# Patient Record
Sex: Female | Born: 1965 | Race: Black or African American | Hispanic: No | Smoking: Never smoker
Health system: Southern US, Community
[De-identification: ages and names within clinical notes are randomized; demographics above are authoritative.]

## PROBLEM LIST (undated history)

## (undated) DIAGNOSIS — K219 Gastro-esophageal reflux disease without esophagitis: Secondary | ICD-10-CM

## (undated) DIAGNOSIS — I10 Essential (primary) hypertension: Secondary | ICD-10-CM

## (undated) DIAGNOSIS — M797 Fibromyalgia: Secondary | ICD-10-CM

## (undated) DIAGNOSIS — M1990 Unspecified osteoarthritis, unspecified site: Secondary | ICD-10-CM

## (undated) HISTORY — PX: OTHER SURGICAL HISTORY: SHX169

## (undated) HISTORY — DX: Fibromyalgia: M79.7

## (undated) HISTORY — PX: HEMORRHOID SURGERY: SHX153

## (undated) HISTORY — PX: EXPLORATORY LAPAROTOMY: SUR591

## (undated) HISTORY — PX: CHOLECYSTECTOMY: SHX55

---

## 2002-09-14 ENCOUNTER — Emergency Department (HOSPITAL_COMMUNITY): Admission: EM | Admit: 2002-09-14 | Discharge: 2002-09-15 | Payer: Self-pay | Admitting: Emergency Medicine

## 2003-03-29 ENCOUNTER — Other Ambulatory Visit: Admission: RE | Admit: 2003-03-29 | Discharge: 2003-03-29 | Payer: Self-pay | Admitting: Family Medicine

## 2004-07-08 ENCOUNTER — Encounter: Admission: RE | Admit: 2004-07-08 | Discharge: 2004-07-08 | Payer: Self-pay | Admitting: Neurology

## 2004-08-02 ENCOUNTER — Emergency Department (HOSPITAL_COMMUNITY): Admission: EM | Admit: 2004-08-02 | Discharge: 2004-08-03 | Payer: Self-pay | Admitting: Emergency Medicine

## 2005-05-04 ENCOUNTER — Emergency Department (HOSPITAL_COMMUNITY): Admission: EM | Admit: 2005-05-04 | Discharge: 2005-05-05 | Payer: Self-pay | Admitting: Emergency Medicine

## 2005-11-05 ENCOUNTER — Other Ambulatory Visit: Admission: RE | Admit: 2005-11-05 | Discharge: 2005-11-05 | Payer: Self-pay | Admitting: Obstetrics and Gynecology

## 2006-04-22 ENCOUNTER — Inpatient Hospital Stay (HOSPITAL_COMMUNITY): Admission: AD | Admit: 2006-04-22 | Discharge: 2006-04-23 | Payer: Self-pay | Admitting: Obstetrics & Gynecology

## 2006-04-23 ENCOUNTER — Inpatient Hospital Stay (HOSPITAL_COMMUNITY): Admission: AD | Admit: 2006-04-23 | Discharge: 2006-04-26 | Payer: Self-pay | Admitting: Obstetrics & Gynecology

## 2006-06-19 ENCOUNTER — Inpatient Hospital Stay (HOSPITAL_COMMUNITY): Admission: AD | Admit: 2006-06-19 | Discharge: 2006-06-19 | Payer: Self-pay | Admitting: Obstetrics & Gynecology

## 2006-06-19 ENCOUNTER — Inpatient Hospital Stay (HOSPITAL_COMMUNITY): Admission: AD | Admit: 2006-06-19 | Discharge: 2006-06-20 | Payer: Self-pay | Admitting: Obstetrics and Gynecology

## 2006-06-19 ENCOUNTER — Ambulatory Visit: Payer: Self-pay | Admitting: Obstetrics and Gynecology

## 2006-06-23 ENCOUNTER — Inpatient Hospital Stay (HOSPITAL_COMMUNITY): Admission: AD | Admit: 2006-06-23 | Discharge: 2006-06-26 | Payer: Self-pay | Admitting: Obstetrics & Gynecology

## 2006-07-30 ENCOUNTER — Emergency Department (HOSPITAL_COMMUNITY): Admission: EM | Admit: 2006-07-30 | Discharge: 2006-07-30 | Payer: Self-pay | Admitting: Emergency Medicine

## 2006-09-25 ENCOUNTER — Encounter (INDEPENDENT_AMBULATORY_CARE_PROVIDER_SITE_OTHER): Payer: Self-pay | Admitting: Specialist

## 2006-09-25 ENCOUNTER — Ambulatory Visit (HOSPITAL_BASED_OUTPATIENT_CLINIC_OR_DEPARTMENT_OTHER): Admission: RE | Admit: 2006-09-25 | Discharge: 2006-09-25 | Payer: Self-pay | Admitting: General Surgery

## 2006-09-26 ENCOUNTER — Emergency Department (HOSPITAL_COMMUNITY): Admission: EM | Admit: 2006-09-26 | Discharge: 2006-09-27 | Payer: Self-pay | Admitting: Emergency Medicine

## 2007-08-15 ENCOUNTER — Encounter: Admission: RE | Admit: 2007-08-15 | Discharge: 2007-08-15 | Payer: Self-pay | Admitting: Orthopedic Surgery

## 2008-06-21 ENCOUNTER — Emergency Department (HOSPITAL_COMMUNITY): Admission: EM | Admit: 2008-06-21 | Discharge: 2008-06-22 | Payer: Self-pay | Admitting: Emergency Medicine

## 2009-05-31 ENCOUNTER — Emergency Department (HOSPITAL_COMMUNITY): Admission: EM | Admit: 2009-05-31 | Discharge: 2009-05-31 | Payer: Self-pay | Admitting: Emergency Medicine

## 2009-09-10 ENCOUNTER — Emergency Department (HOSPITAL_COMMUNITY): Admission: EM | Admit: 2009-09-10 | Discharge: 2009-09-10 | Payer: Self-pay | Admitting: Emergency Medicine

## 2009-09-26 ENCOUNTER — Encounter (INDEPENDENT_AMBULATORY_CARE_PROVIDER_SITE_OTHER): Payer: Self-pay | Admitting: General Surgery

## 2009-09-26 ENCOUNTER — Ambulatory Visit (HOSPITAL_COMMUNITY): Admission: RE | Admit: 2009-09-26 | Discharge: 2009-09-27 | Payer: Self-pay | Admitting: General Surgery

## 2010-08-18 ENCOUNTER — Encounter: Payer: Self-pay | Admitting: Obstetrics and Gynecology

## 2010-10-16 LAB — DIFFERENTIAL
Basophils Absolute: 0 10*3/uL (ref 0.0–0.1)
Basophils Relative: 1 % (ref 0–1)
Eosinophils Absolute: 0.2 10*3/uL (ref 0.0–0.7)
Eosinophils Relative: 5 % (ref 0–5)
Lymphocytes Relative: 44 % (ref 12–46)
Lymphs Abs: 1.8 10*3/uL (ref 0.7–4.0)
Monocytes Absolute: 0.3 10*3/uL (ref 0.1–1.0)
Monocytes Relative: 8 % (ref 3–12)
Neutro Abs: 1.7 10*3/uL (ref 1.7–7.7)
Neutrophils Relative %: 42 % — ABNORMAL LOW (ref 43–77)

## 2010-10-16 LAB — LIPASE, BLOOD: Lipase: 19 U/L (ref 11–59)

## 2010-10-16 LAB — URINALYSIS, ROUTINE W REFLEX MICROSCOPIC
Bilirubin Urine: NEGATIVE
Glucose, UA: NEGATIVE mg/dL
Ketones, ur: NEGATIVE mg/dL
Leukocytes, UA: NEGATIVE
Nitrite: NEGATIVE
Protein, ur: NEGATIVE mg/dL
Specific Gravity, Urine: 1.015 (ref 1.005–1.030)
Urobilinogen, UA: 1 mg/dL (ref 0.0–1.0)
pH: 6 (ref 5.0–8.0)

## 2010-10-16 LAB — COMPREHENSIVE METABOLIC PANEL
ALT: 19 U/L (ref 0–35)
AST: 18 U/L (ref 0–37)
Albumin: 3.8 g/dL (ref 3.5–5.2)
Alkaline Phosphatase: 45 U/L (ref 39–117)
BUN: 9 mg/dL (ref 6–23)
CO2: 26 mEq/L (ref 19–32)
Calcium: 8.9 mg/dL (ref 8.4–10.5)
Chloride: 106 mEq/L (ref 96–112)
Creatinine, Ser: 0.84 mg/dL (ref 0.4–1.2)
GFR calc Af Amer: >60 mL/min (ref >60)
GFR calc non Af Amer: >60 mL/min (ref >60)
Glucose, Bld: 87 mg/dL (ref 70–99)
Potassium: 3.4 mEq/L — ABNORMAL LOW (ref 3.5–5.1)
Sodium: 137 mEq/L (ref 135–145)
Total Bilirubin: 0.5 mg/dL (ref 0.3–1.2)
Total Protein: 7.9 g/dL (ref 6.0–8.3)

## 2010-10-16 LAB — CBC
HCT: 39.3 % (ref 36.0–46.0)
Hemoglobin: 13.8 g/dL (ref 12.0–15.0)
MCHC: 35.2 g/dL (ref 30.0–36.0)
MCV: 95.3 fL (ref 78.0–100.0)
Platelets: 225 10*3/uL (ref 150–400)
RBC: 4.13 MIL/uL (ref 3.87–5.11)
RDW: 13.2 % (ref 11.5–15.5)
WBC: 4.1 10*3/uL (ref 4.0–10.5)

## 2010-10-16 LAB — URINE MICROSCOPIC-ADD ON

## 2010-10-16 LAB — POCT PREGNANCY, URINE: Preg Test, Ur: NEGATIVE

## 2010-10-20 LAB — COMPREHENSIVE METABOLIC PANEL
ALT: 17 U/L (ref 0–35)
AST: 17 U/L (ref 0–37)
Albumin: 4 g/dL (ref 3.5–5.2)
Alkaline Phosphatase: 50 U/L (ref 39–117)
BUN: 7 mg/dL (ref 6–23)
CO2: 25 mEq/L (ref 19–32)
Calcium: 9.2 mg/dL (ref 8.4–10.5)
Chloride: 103 mEq/L (ref 96–112)
Creatinine, Ser: 0.85 mg/dL (ref 0.4–1.2)
GFR calc Af Amer: >60 mL/min (ref >60)
GFR calc non Af Amer: >60 mL/min (ref >60)
Glucose, Bld: 91 mg/dL (ref 70–99)
Potassium: 3.5 mEq/L (ref 3.5–5.1)
Sodium: 136 mEq/L (ref 135–145)
Total Bilirubin: 0.8 mg/dL (ref 0.3–1.2)
Total Protein: 7.8 g/dL (ref 6.0–8.3)

## 2010-10-20 LAB — CBC
HCT: 40.4 % (ref 36.0–46.0)
Hemoglobin: 14.1 g/dL (ref 12.0–15.0)
MCHC: 34.9 g/dL (ref 30.0–36.0)
MCV: 94.9 fL (ref 78.0–100.0)
Platelets: 210 10*3/uL (ref 150–400)
Platelets: 225 10*3/uL (ref 150–400)
RBC: 4.26 MIL/uL (ref 3.87–5.11)
RDW: 13 % (ref 11.5–15.5)
WBC: 10.3 10*3/uL (ref 4.0–10.5)
WBC: 4.4 10*3/uL (ref 4.0–10.5)

## 2010-10-20 LAB — ABO/RH: ABO/RH(D): A POS

## 2010-10-20 LAB — TYPE AND SCREEN
ABO/RH(D): A POS
Antibody Screen: NEGATIVE

## 2010-10-20 LAB — DIFFERENTIAL
Basophils Absolute: 0 10*3/uL (ref 0.0–0.1)
Basophils Relative: 1 % (ref 0–1)
Eosinophils Absolute: 0.2 10*3/uL (ref 0.0–0.7)
Eosinophils Relative: 5 % (ref 0–5)
Lymphocytes Relative: 36 % (ref 12–46)
Lymphs Abs: 1.6 10*3/uL (ref 0.7–4.0)
Monocytes Absolute: 0.4 10*3/uL (ref 0.1–1.0)
Monocytes Relative: 9 % (ref 3–12)
Neutro Abs: 2.2 10*3/uL (ref 1.7–7.7)
Neutrophils Relative %: 49 % (ref 43–77)

## 2010-10-20 LAB — PREGNANCY, URINE: Preg Test, Ur: NEGATIVE

## 2010-12-13 NOTE — Discharge Summary (Signed)
NAMEKRYSTL, WICKWARE NO.:  [id number]   MEDICAL RECORD NO.:  [id number]          PATIENT TYPE:  INP   LOCATION:  9134                          [facility]ACILITY:  [facility]   PHYSICIAN:  Gerrit Friends. Aldona Bar, M.D.   DATE OF BIRTH:  [birth year]   DATE OF ADMISSION:  06/23/2006  DATE OF DISCHARGE:  06/26/2006                               DISCHARGE SUMMARY   DISCHARGE DIAGNOSES:  1. Term pregnancy, delivered 7 pounds 4 ounces female infant, Apgar's 8      and 9.  2. Blood type A positive.   PROCEDURES:  1. Low forceps delivery.  2. Second-degree tear repair.  3. Right vaginal wall tear and repair.  4. Clitoral tear and repair.   SUMMARY:  This 45 year old primigravida with a due date of June 25, 2006, was admitted on June 23, 2006, in labor.  She progressed  through the first stage without difficulty and during the first stage  underwent amnioinfusion secondary to meconium-stained fluid.  She also  had Pitocin augmentation and requested and received an epidural.  There  was a fairly poor pushing effort during the second stage of labor which  ultimately required a low forceps delivery by Dr. Dareen Piano with delivery  of a 7 pounds and 4 ounces female infant with Apgar's of  8 and 9, over  second-degree tear with other tears which were repaired as well.  Her postpartum course was fairly benign.  On the morning of June 26, 2006, she was ambulating well, tolerating a regular diet well, having  normal bowel and bladder function, was afebrile.  Her breast-feeding was  going well.  And, she was desirous of discharge.   Her discharge hemoglobin was 9.9 with a white count of 13,500 and a  platelet count 213,000.  Accordingly, on the morning of June 26, 2006, she was given all appropriate instructions and understood all  instructions well.   DISCHARGE MEDICATIONS:  1. Vitamins, one a day.  2. Feosol capsules, one a day.  3. Tylox 1-2 every four to six hours as needed for  severe pain.  4. Motrin 600 mg every 6 hours as needed for pain or cramps.   She will return to the office for followup in approximately four weeks'  time or as needed.   CONDITION ON DISCHARGE:  Improved.     Gerrit Friends. Aldona Bar, M.D.  Electronically Signed    RMW/MEDQ  D:  06/26/2006  T:  06/26/2006  Job:  [id number]

## 2010-12-13 NOTE — Op Note (Signed)
NAMERUHAMA, LEHEW NO.:  [id number]   MEDICAL RECORD NO.:  [id number]          PATIENT TYPE:  AMB   LOCATION:  NESC                         [facility]AC[facility]  [facility]   PHYSICIAN:  Laura Medina, M.D.DATE OF BIRTH:  1966-02-04   DATE OF PROCEDURE:  09/25/2006  DATE OF DISCHARGE:                               OPERATIVE REPORT   PREOPERATIVE DIAGNOSIS:  Internal and external hemorrhoids.   OPERATION:  Excision of two internal and external hemorrhoids, the  largest was anterior and a medium-sized right lateral posterior.   ANESTHESIA:  General anesthesia in the lithotomy position.   SURGEON:  Laura Pancoast. Laura Medina, M.D.   INDICATIONS FOR PROCEDURE:  [name] is a 45 year old  female, whom I first saw in December when she was a few weeks  postpartum.  She had significant problems with hemorrhoids.  I suggested  that we wait and let things improve from just getting over her delivery.  She returned recently.  Dr. Caryn Bee L. Medina is her regular medical  physician.  The large hemorrhoid anterior was still bothersome.  The two  on the other quadrants had resolved, so that they were essentially what  I considered normal.  She is a heavy individual and wanted to proceed on  with the removal of the large hemorrhoid.  I discussed with her that if  we saw anything that was actually significantly abnormal in the other  quadrants, that we would remove them, but it might be just a simple  internal and external hemorrhoid anteriorly.   DESCRIPTION OF PROCEDURE:  The patient was taken to the operative suite  and positioned on the operating room table.  She had received 1 gram of  Ancef, and the patient was placed up in the __________ stirrups, after  induction of anesthesia.  In this position, the hemorrhoid anteriorly  certainly was the largest.  It looked like it possibly was a Medina bit  more prominent on the right lateral than when I had seen her in the  office.  I anesthetized the area with Marcaine, about 20 mL, and  Sensorcaine with adrenalin.  Next, on an anoscopic exam the largest of  course is the anterior hemorrhoid, and I elevated it from the sphincter  under direct vision and then used a Buie clamp down to the internal  portion of the hemorrhoidectomy, clamped it and removed the hemorrhoid.  I did a couple of U-stitches of #2-0 chromic for hemostasis and then  sutured over the clamp and removed the clamp and enclosed the external  portion of the hemorrhoidectomy incision.  A couple of #3-0 chromic  sutures were removed for a Medina bleeding right on the skin edges.  After I completed this, the area on the right lateral posterior was  certainly more prominent and I thought it would best to go ahead and  excise this internal and external hemorrhoid as a much smaller  hemorrhoid in the same technique.  I elevated it off the sphincter with  sharp dissection, and then under-clamping the base of the internal  hemorrhoid, removing the hemorrhoid.  A couple of #2-0  chromic sutures  for hemostasis under the clamp, and then suturing over the clamp.  I  removed the clamp and then closed the external portion of the  hemorrhoidectomy.  A single figure-of-eight of #3-0 chromic was used on  one Medina skin edge in the anal canal.  The patient tolerated the  procedure nicely.  I used the anesthetic solution ointment within the  anal canal, having placed a gauze 4 x 4 and then compressive 4 x 4's and  held in place with Medina stretch panties.   The patient tolerated the procedure and will be released after a short  stay.  She is nursing, and I recommended that she not nurse for  approximately two days.  If she is not requiring much pain medication,  she can use the 5% Xylocaine ointment and resume nursing probably next  Tuesday.  The patient will be seen back in the office in about 10 days.  Hopefully she will not have too much significant  discomfort  postoperatively.           ______________________________  Laura Pancoast. Laura Medina, M.D.     WJW/MEDQ  D:  09/25/2006  T:  09/25/2006  Job:  [id number]   cc:   Laura Medina, M.D.  Fax: [phone]

## 2010-12-13 NOTE — Discharge Summary (Signed)
Laura Medina, Laura Medina            ACCOUNT NO.:  [id number]   MEDICAL RECORD NO.:  [id number]          PATIENT TYPE:  INP   LOCATION:  9159                          [facility]ILITY:  [facility]   PHYSICIAN:  Kendra H. Tenny Craw, MD     DATE OF BIRTH:  [birth year]   DATE OF ADMISSION:  04/23/2006  DATE OF DISCHARGE:  04/26/2006                                 DISCHARGE SUMMARY   FINAL DIAGNOSES:  1. Intrauterine pregnancy at 31-3/7th weeks gestation.  2. Preterm labor with shortened cervical length.  3. Right lower abdominal pain.   COMPLICATIONS:  None.   This 45 year old G1, P0 presents at 31+ weeks' gestation complaining of some  right lower quadrant pain.  The patient had an ultrasound performed which  showed a cervix that was shortened to 1.70 cm.  The patient was admitted at  this time to start a betamethasone protocol and to watch the patient  carefully.  The patient upon admission was started on betamethasone.  The  patient had already had a group B strep culture obtained in the office.  Lab  work was obtained and the patient was started on Procardia orally 10 mg  every 6 hours.  A renal ultrasound was performed which showed no evidence of  hydronephrosis.  Her strips were nice and reactive and she had normal OB  ultrasound except for a shortened cervix.  A fetal fibronectin was performed  and this came back negative.   By hospital day #4, the patient was doing well.  Her right lower quadrant  pain had significantly decreased and she was felt ready for discharge on her  Procardia.  She was sent home on a regular diet, told to decrease her  activities, was given Procardia 10 mg one every 6 hours to take and of  course to call with any increased pain, bleeding or problems.   LABS ON DISCHARGE:  The patient had a hemoglobin of 13.1, white blood cell  count of 9.4 and negative fetal fibronectin.      Leilani Able, P.A.-C.    ______________________________  Freddrick March. Tenny Craw, MD    MB/MEDQ  D:  05/26/2006  T:  05/26/2006  Job:  [id number]

## 2011-04-29 LAB — URINALYSIS, ROUTINE W REFLEX MICROSCOPIC
Nitrite: NEGATIVE
Specific Gravity, Urine: 1.038 — ABNORMAL HIGH
pH: 5.5

## 2011-04-29 LAB — COMPREHENSIVE METABOLIC PANEL
ALT: 17
AST: 31
Albumin: 3.6
CO2: 22
Calcium: 8.3 — ABNORMAL LOW
Chloride: 104
GFR calc Af Amer: >60
GFR calc non Af Amer: >60
Sodium: 134 — ABNORMAL LOW

## 2011-04-29 LAB — DIFFERENTIAL
Eosinophils Absolute: 0.3
Eosinophils Relative: 5
Lymphs Abs: 1.4
Monocytes Absolute: 0.5

## 2011-04-29 LAB — CBC
MCHC: 34.5
Platelets: 228
RBC: 4.32
WBC: 5.5

## 2011-04-29 LAB — LIPASE, BLOOD: Lipase: 12

## 2011-09-25 ENCOUNTER — Emergency Department (HOSPITAL_COMMUNITY): Payer: BC Managed Care – PPO

## 2011-09-25 ENCOUNTER — Emergency Department (HOSPITAL_COMMUNITY)
Admission: EM | Admit: 2011-09-25 | Discharge: 2011-09-25 | Disposition: A | Discharge status: Home / Self Care | Payer: BC Managed Care – PPO | Attending: Emergency Medicine | Admitting: Emergency Medicine

## 2011-09-25 ENCOUNTER — Encounter (HOSPITAL_COMMUNITY): Payer: Self-pay | Admitting: *Deleted

## 2011-09-25 DIAGNOSIS — S0990XA Unspecified injury of head, initial encounter: Secondary | ICD-10-CM

## 2011-09-25 DIAGNOSIS — M546 Pain in thoracic spine: Secondary | ICD-10-CM | POA: Insufficient documentation

## 2011-09-25 DIAGNOSIS — R110 Nausea: Secondary | ICD-10-CM | POA: Insufficient documentation

## 2011-09-25 DIAGNOSIS — R51 Headache: Secondary | ICD-10-CM | POA: Insufficient documentation

## 2011-09-25 DIAGNOSIS — S060X0A Concussion without loss of consciousness, initial encounter: Secondary | ICD-10-CM | POA: Insufficient documentation

## 2011-09-25 DIAGNOSIS — M549 Dorsalgia, unspecified: Secondary | ICD-10-CM

## 2011-09-25 DIAGNOSIS — R296 Repeated falls: Secondary | ICD-10-CM | POA: Insufficient documentation

## 2011-09-25 HISTORY — DX: Unspecified osteoarthritis, unspecified site: M19.90

## 2011-09-25 MED ORDER — IBUPROFEN 800 MG PO TABS
800.0000 mg | ORAL_TABLET | Freq: Once | ORAL | Status: AC
  Administered 2011-09-25: 800 mg via ORAL
  Filled 2011-09-25: qty 1

## 2011-09-25 MED ORDER — ONDANSETRON 8 MG PO TBDP: 8.0000 mg | ORAL_TABLET | Freq: Three times a day (TID) | ORAL | Status: AC | PRN

## 2011-09-25 MED ORDER — ONDANSETRON 8 MG PO TBDP
8.0000 mg | ORAL_TABLET | Freq: Once | ORAL | Status: AC
  Administered 2011-09-25: 8 mg via ORAL
  Filled 2011-09-25: qty 1

## 2011-09-25 MED ORDER — IBUPROFEN 800 MG PO TABS: 800.0000 mg | ORAL_TABLET | Freq: Three times a day (TID) | ORAL | Status: AC | PRN

## 2011-09-25 NOTE — Discharge Instructions (Signed)
Back Pain, Adult Low back pain is very common. About 1 in 5 people have back pain.The cause of low back pain is rarely dangerous. The pain often gets better over time.About half of people with a sudden onset of back pain feel better in just 2 weeks. About 8 in 10 people feel better by 6 weeks.  CAUSES Some common causes of back pain include:  Strain of the muscles or ligaments supporting the spine.   Wear and tear (degeneration) of the spinal discs.   Arthritis.   Direct injury to the back.  DIAGNOSIS Most of the time, the direct cause of low back pain is not known.However, back pain can be treated effectively even when the exact cause of the pain is unknown.Answering your caregiver's questions about your overall health and symptoms is one of the most accurate ways to make sure the cause of your pain is not dangerous. If your caregiver needs more information, he or she may order lab work or imaging tests (X-rays or MRIs).However, even if imaging tests show changes in your back, this usually does not require surgery. HOME CARE INSTRUCTIONS For many people, back pain returns.Since low back pain is rarely dangerous, it is often a condition that people can learn to manageon their own.   Remain active. It is stressful on the back to sit or stand in one place. Do not sit, drive, or stand in one place for more than 30 minutes at a time. Take short walks on level surfaces as soon as pain allows.Try to increase the length of time you walk each day.   Do not stay in bed.Resting more than 1 or 2 days can delay your recovery.   Do not avoid exercise or work.Your body is made to move.It is not dangerous to be active, even though your back may hurt.Your back will likely heal faster if you return to being active before your pain is gone.   Pay attention to your body when you bend and lift. Many people have less discomfortwhen lifting if they bend their knees, keep the load close to their  bodies,and avoid twisting. Often, the most comfortable positions are those that put less stress on your recovering back.   Find a comfortable position to sleep. Use a firm mattress and lie on your side with your knees slightly bent. If you lie on your back, put a pillow under your knees.   Only take over-the-counter or prescription medicines as directed by your caregiver. Over-the-counter medicines to reduce pain and inflammation are often the most helpful.Your caregiver may prescribe muscle relaxant drugs.These medicines help dull your pain so you can more quickly return to your normal activities and healthy exercise.   Put ice on the injured area.   Put ice in a plastic bag.   Place a towel between your skin and the bag.   Leave the ice on for 15 to 20 minutes, 3 to 4 times a day for the first 2 to 3 days. After that, ice and heat may be alternated to reduce pain and spasms.   Ask your caregiver about trying back exercises and gentle massage. This may be of some benefit.   Avoid feeling anxious or stressed.Stress increases muscle tension and can worsen back pain.It is important to recognize when you are anxious or stressed and learn ways to manage it.Exercise is a great option.  SEEK MEDICAL CARE IF:  You have pain that is not relieved with rest or medicine.   You have   pain that does not improve in 1 week.   You have new symptoms.   You are generally not feeling well.  SEEK IMMEDIATE MEDICAL CARE IF:   You have pain that radiates from your back into your legs.   You develop new bowel or bladder control problems.   You have unusual weakness or numbness in your arms or legs.   You develop nausea or vomiting.   You develop abdominal pain.   You feel faint.  Document Released: 07/14/2005 Document Revised: 03/26/2011 Document Reviewed: 12/02/2010 [facility] Patient Information 2012 [place], [place].Concussion Direct trauma to the head often causes a condition known as a  concussion. This injury will interfere with brain function and may cause you to lose consciousness. The consequences of a concussion are usually temporary, but repetitive concussions can be very dangerous. If you have multiple concussions, you will have a greater risk of long-term effects, such as slurred speech, slow movements, impaired thinking, or tremors. The severity of a concussion is based on the length and severity of the interference with brain activity. SYMPTOMS  Symptoms of a concussion vary depending on the severity of the injury. Very mild concussions may even occur without any noticeable symptoms. Swelling in the area of the injury is not related to the seriousness of the injury.   Mild concussion:   Temporary loss of consciousness.   Memory loss (amnesia) for a short time.   Emotional instability.   Confusion.   Severe concussion:   Usually prolonged loss of consciousness.   One pupil (the black part in the middle of the eye) is larger than the other.   Changes in vision (including blurring).   Changes in breathing.   Disturbed balance (equilibrium).   Headaches.   Confusion.   Nausea or vomiting.  CAUSES  A concussion is the result of trauma to the head. When the head is subjected to such an injury, the brain strikes against the inner wall of the skull. This impact is what causes the damage to the brain. The force of injury is related to severity of injury. The most severe concussions are associated with incidents that involve large impact forces such as motor vehicle accidents. Wearing a helmet will reduce the severity of trauma to the head, but concussions may still occur if you are wearing a helmet. RISK INCREASES WITH:  Contact sports (football, hockey, rugby, or lacrosse).   Fighting sports (martial arts or boxing).   Riding bicycles, motorcycles, or horses (when you ride without a helmet).  PREVENTION  Wear proper protective headgear and ensure correct  fit.   Wear seat belts when driving and riding in a car.   Do not drink or use mind-altering drugs and drive.  PROGNOSIS  Concussions are typically curable if they are recognized and treated early. If a severe concussion or multiple concussions go untreated, then the complications may be life-threatening or cause permanent disability and brain damage. RELATED COMPLICATIONS   Permanent brain damage (slurred speech, slow movement, impaired thinking, or tremors).   Bleeding under the skull (subdural hemorrhage or hematoma, epidural hematoma).   Bleeding into the brain.   Prolonged healing time if usual activities are resumed too soon.   Infection if skin over the concussion site is broken.   Increased risk of future concussions (less trauma is required for a second concussion than the first).  TREATMENT  Treatment initially requires immediate evaluation to determine the severity of the concussion. Occasionally, a hospital stay may be required for observation  and treatment.  Avoid exertion. Bed rest for the first 24 to 48 hours is recommended.  Return to play is a controversial subject due to the increased risk for future injury as well as permanent disability and should be discussed at length with your treating caregiver. Many factors such as the severity of the concussion and whether this is the first, second, or third concussion play a role in timing a patient's return to sports.  MEDICATION  Do not give any medicine, including non-prescription acetaminophen or aspirin, until the diagnosis is certain. These medicines may mask developing symptoms.  SEEK IMMEDIATE MEDICAL CARE IF:   Symptoms get worse or do not improve in 24 hours.   Any of the following symptoms occur:   Vomiting.   The inability to move arms and legs equally well on both sides.   Fever.   Neck stiffness.   Pupils of unequal size, shape, or reactivity.   Convulsions.   Noticeable restlessness.   Severe  headache that persists for longer than 4 hours after injury.   Confusion, disorientation, or mental status changes.  Document Released: 07/14/2005 Document Revised: 03/26/2011 Document Reviewed: 10/26/2008 [facility] Patient Information 2012 [place], [place]. RESOURCE GUIDE  Dental Problems  Patients with Medicaid: [facility] [phone] [address]                                           [phone] [address] Phone:  [phone]                                                  Phone:  [phone]  If unable to pay or uninsured, contact:  [facility] or [facility]. to become qualified for the adult dental clinic.  Chronic Pain Problems Contact [facility]  [phone] Patients need to be referred by their primary care doctor.  Insufficient Money for Medicine Contact [employer]:  call "211" or [facility] [phone].  No Primary Care Doctor Call [facility]  [phone] Other agencies that provide inexpensive medical care    [facility]  [phone]    [facility]  [phone]    [facility]  [phone]    [facility]  [phone]    [facility]  2183617687    [facility]  [phone]  Psychological Services [facility]  [phone] [facility]  2066947594 [facility]   [phone] (emergency services ([phone])  Substance Abuse Resources [facility]  661-408-7790 [facility] 843-731-8928 [facility] [phone] Floydene Flock 4326754871 Residential & Outpatient Substance Abuse Program  3104089317  Abuse/Neglect [facility] Child Abuse Hotline 260-035-4732 [facility] Child Abuse Hotline 757-331-9472 (After Hours)  Emergency Shelter [facility] 336 301 3720  Maternity Homes [facility] at [facility] [place] [facility] [place] 571-026-4111 [facility]  Services ([phone])058-1945  MRSA Hotline #:   (904)183-5589    [facility] [place]  [facility] Dept. [address]      [place] Phone:  [phone]                                   Phone:  [phone]                 Phone:  [place] Phone:  [place] [phone] (After Hours)

## 2011-09-25 NOTE — ED Provider Notes (Signed)
History     CSN: [id number]  Arrival date & time 09/25/11  1404   First MD Initiated Contact with Patient 09/25/11 1503      Chief Complaint  Patient presents with  . Head Injury    (Consider location/radiation/quality/duration/timing/severity/associated sxs/prior treatment) Patient is a 46 y.o. female presenting with fall. The history is provided by the patient.  Fall The accident occurred 3 to 5 hours ago. Incident: The patient went to sit down in a chair, missed the chair, and fell onto her buttocks, back, and hit the back of her head on the floor. She denies any loss of consciousness or neck pain, but reports headache, nausea, and mid back pain. She fell from a height of 1 to 2 ft. She landed on a hard floor. There was no blood loss. The point of impact was the head (Buttocks and mid back). The pain is present in the head (Mid back, thoracic spine). The pain is moderate. She was ambulatory at the scene. There was no entrapment after the fall. There was no drug use involved in the accident. There was no alcohol use involved in the accident. Associated symptoms include nausea and headaches. Pertinent negatives include no visual change, no fever, no numbness, no abdominal pain, no bowel incontinence, no vomiting, no hearing loss, no loss of consciousness and no tingling. Exacerbated by: Palpation. She has tried nothing for the symptoms.    Past Medical History  Diagnosis Date  . Arthritis     Past Surgical History  Procedure Date  . Hemorrhoid surgery   . Blood clot evacuation     left leg  . Exploratory laparotomy   . Cholecystectomy     History reviewed. No pertinent family history.  History  Substance Use Topics  . Smoking status: Never Smoker   . Smokeless tobacco: Not on file  . Alcohol Use: No    OB History    Grav Para Term Preterm Abortions TAB SAB Ect Mult Living                  Review of Systems  Constitutional: Negative for fever, chills, diaphoresis and  appetite change.  HENT: Negative for hearing loss, ear pain, congestion, sore throat, facial swelling, rhinorrhea, trouble swallowing, neck pain, neck stiffness, dental problem, sinus pressure and tinnitus.   Eyes: Negative for photophobia, pain, discharge, redness and visual disturbance.  Respiratory: Negative.   Cardiovascular: Negative.   Gastrointestinal: Positive for nausea. Negative for vomiting, abdominal pain and bowel incontinence.  Genitourinary: Negative.   Musculoskeletal: Positive for back pain. Negative for gait problem.  Skin: Negative for rash.  Neurological: Positive for headaches. Negative for dizziness, tingling, seizures, loss of consciousness, syncope, light-headedness and numbness.  Psychiatric/Behavioral: Negative for confusion.    Allergies  Hydrocodone; Oxycodone; and Penicillins  Home Medications   Current Outpatient Rx  Name Route Sig Dispense Refill  . TRAMADOL HCL 50 MG PO TABS Oral Take 50 mg by mouth every 6 (six) hours as needed. pain      BP 143/69  Pulse 74  Temp(Src) 98.4 F (36.9 C) (Oral)  Resp 16  Wt 270 lb 9.6 oz (122.743 kg)  SpO2 99%  LMP 09/20/2011  Physical Exam  Constitutional: She is oriented to person, place, and time. She appears well-developed and well-nourished. No distress.  HENT:  Head: Normocephalic and atraumatic. Head is without raccoon's eyes, without Battle's sign, without abrasion, without contusion and without laceration.  Right Ear: Hearing, tympanic membrane, external ear and  ear canal normal.  Left Ear: Hearing, tympanic membrane, external ear and ear canal normal.  Nose: Nose normal.  Mouth/Throat: Oropharynx is clear and moist. No oropharyngeal exudate.       Tenderness to palpation of the scalp over the occiput, but no broken skin, bleeding, or evident hematoma or underlying deformity.  Eyes: Conjunctivae and EOM are normal. Pupils are equal, round, and reactive to light. Right eye exhibits no nystagmus. Left eye  exhibits no nystagmus.  Fundoscopic exam:      The right eye shows no papilledema.       The left eye shows no papilledema.  Neck: Normal range of motion, full passive range of motion without pain and phonation normal. Neck supple. Carotid bruit is not present.       No midline cervical spine tenderness  Cardiovascular: Normal rate, regular rhythm, normal heart sounds and intact distal pulses.  Exam reveals no gallop and no friction rub.   No murmur heard. Pulmonary/Chest: Effort normal and breath sounds normal. No respiratory distress. She has no wheezes. She has no rales.  Abdominal: Soft. Bowel sounds are normal. There is no tenderness.  Musculoskeletal: Normal range of motion. She exhibits no edema and no tenderness.       Cervical back: Normal.       Thoracic back: She exhibits tenderness, bony tenderness and pain. She exhibits normal range of motion, no swelling, no edema, no deformity and no spasm.       Lumbar back: Normal.  Neurological: She is alert and oriented to person, place, and time. She has normal reflexes. She displays normal reflexes. No cranial nerve deficit or sensory deficit. She exhibits normal muscle tone. Coordination and gait normal. GCS eye subscore is 4. GCS verbal subscore is 5. GCS motor subscore is 6.  Reflex Scores:      Bicep reflexes are 2+ on the right side and 2+ on the left side.      Brachioradialis reflexes are 2+ on the right side and 2+ on the left side.      Patellar reflexes are 2+ on the right side and 2+ on the left side.      Normal steady gait  Skin: Skin is warm and dry. No rash noted. She is not diaphoretic. No erythema. No pallor.  Psychiatric: She has a normal mood and affect. Her behavior is normal. Judgment and thought content normal.    ED Course  Procedures (including critical care time)  Labs Reviewed - No data to display No results found.   No diagnosis found.    MDM  The patient appears to have sustained a minor head injury  likely with mild concussion causing headache and nausea. She has no focal neurologic deficits, has had no loss of consciousness, has had no vomiting, and has had no change in mental status or level of alertness. At this time I do not see indication for CT imaging of the brain to exclude injury, as there is no significant intracranial injury suggested by the mechanism, history, or physical examination. I informed the patient of my findings and recommendations and she states her understanding and agreement with this plan. I will treat her with ibuprofen and Zofran for headache and nausea. Otherwise, she has isolated tenderness to palpation of the midline thoracic spine without any palpable instability or deformity, but I will obtain a plain x-ray of her thoracic spine to exclude injury.        Felisa Bonier, MD 09/25/11 [phone]

## 2011-09-25 NOTE — ED Notes (Signed)
Pt states "I was moving a chair and I missed it, falling & hitting my head on the floor, it didn't knock me out but I had to sit there for a while, hit the back of my head, is sore to touch, my head is spinning, mid to upper back also hurts"

## 2011-09-25 NOTE — ED Notes (Signed)
Pt alert and oriented x4. Respirations even and unlabored, bilateral symmetrical rise and fall of chest. Skin warm and dry. In no acute distress. Denies needs.   

## 2012-06-10 ENCOUNTER — Other Ambulatory Visit: Payer: Self-pay | Admitting: Gastroenterology

## 2012-06-10 DIAGNOSIS — R1013 Epigastric pain: Secondary | ICD-10-CM

## 2012-06-14 ENCOUNTER — Other Ambulatory Visit: Payer: Self-pay | Admitting: Gastroenterology

## 2012-06-14 DIAGNOSIS — R1013 Epigastric pain: Secondary | ICD-10-CM

## 2012-06-14 DIAGNOSIS — R110 Nausea: Secondary | ICD-10-CM

## 2012-07-01 ENCOUNTER — Other Ambulatory Visit (HOSPITAL_COMMUNITY): Payer: Self-pay

## 2012-07-01 ENCOUNTER — Ambulatory Visit (HOSPITAL_COMMUNITY): Payer: BC Managed Care – PPO

## 2012-07-05 ENCOUNTER — Encounter (HOSPITAL_COMMUNITY)
Admission: RE | Admit: 2012-07-05 | Discharge: 2012-07-05 | Disposition: A | Discharge status: Home / Self Care | Payer: BC Managed Care – PPO | Source: Ambulatory Visit | Attending: Gastroenterology | Admitting: Gastroenterology

## 2012-07-05 DIAGNOSIS — R110 Nausea: Secondary | ICD-10-CM | POA: Insufficient documentation

## 2012-07-05 DIAGNOSIS — R1013 Epigastric pain: Secondary | ICD-10-CM | POA: Insufficient documentation

## 2012-07-05 MED ORDER — TECHNETIUM TC 99M SULFUR COLLOID
2.2000 | Freq: Once | INTRAVENOUS | Status: AC | PRN
  Administered 2012-07-05: 2.2 via INTRAVENOUS

## 2012-10-13 ENCOUNTER — Other Ambulatory Visit: Payer: Self-pay | Admitting: Orthopedic Surgery

## 2012-10-13 DIAGNOSIS — M461 Sacroiliitis, not elsewhere classified: Secondary | ICD-10-CM

## 2012-10-17 ENCOUNTER — Ambulatory Visit
Admission: RE | Admit: 2012-10-17 | Discharge: 2012-10-17 | Disposition: A | Discharge status: Home / Self Care | Payer: BC Managed Care – PPO | Source: Ambulatory Visit | Attending: Orthopedic Surgery | Admitting: Orthopedic Surgery

## 2012-10-17 DIAGNOSIS — M461 Sacroiliitis, not elsewhere classified: Secondary | ICD-10-CM

## 2012-10-18 ENCOUNTER — Other Ambulatory Visit: Payer: Self-pay

## 2012-10-19 ENCOUNTER — Other Ambulatory Visit: Payer: Self-pay | Admitting: Orthopedic Surgery

## 2012-10-19 DIAGNOSIS — M545 Low back pain, unspecified: Secondary | ICD-10-CM

## 2012-10-22 ENCOUNTER — Ambulatory Visit
Admission: RE | Admit: 2012-10-22 | Discharge: 2012-10-22 | Disposition: A | Discharge status: Home / Self Care | Payer: BC Managed Care – PPO | Source: Ambulatory Visit | Attending: Orthopedic Surgery | Admitting: Orthopedic Surgery

## 2012-10-22 DIAGNOSIS — M545 Low back pain, unspecified: Secondary | ICD-10-CM

## 2013-01-13 ENCOUNTER — Other Ambulatory Visit: Payer: Self-pay | Admitting: Orthopedic Surgery

## 2013-01-13 DIAGNOSIS — M549 Dorsalgia, unspecified: Secondary | ICD-10-CM

## 2013-01-19 ENCOUNTER — Ambulatory Visit
Admission: RE | Admit: 2013-01-19 | Discharge: 2013-01-19 | Disposition: A | Discharge status: Home / Self Care | Payer: BC Managed Care – PPO | Source: Ambulatory Visit | Attending: Orthopedic Surgery | Admitting: Orthopedic Surgery

## 2013-01-19 VITALS — BP 118/62 | HR 79

## 2013-01-19 DIAGNOSIS — M549 Dorsalgia, unspecified: Secondary | ICD-10-CM

## 2013-01-19 MED ORDER — IOHEXOL 180 MG/ML  SOLN
15.0000 mL | Freq: Once | INTRAMUSCULAR | Status: AC | PRN
  Administered 2013-01-19: 15 mL via INTRATHECAL

## 2013-01-19 MED ORDER — DIAZEPAM 5 MG PO TABS
10.0000 mg | ORAL_TABLET | Freq: Once | ORAL | Status: AC
  Administered 2013-01-19: 10 mg via ORAL

## 2013-01-19 MED ORDER — ONDANSETRON HCL 4 MG/2ML IJ SOLN
4.0000 mg | Freq: Once | INTRAMUSCULAR | Status: AC
  Administered 2013-01-19: 4 mg via INTRAMUSCULAR

## 2013-01-19 MED ORDER — MEPERIDINE HCL 100 MG/ML IJ SOLN
100.0000 mg | Freq: Once | INTRAMUSCULAR | Status: AC
  Administered 2013-01-19: 100 mg via INTRAMUSCULAR

## 2013-02-09 ENCOUNTER — Other Ambulatory Visit: Payer: Self-pay | Admitting: Orthopedic Surgery

## 2013-02-09 DIAGNOSIS — M542 Cervicalgia: Secondary | ICD-10-CM

## 2013-02-10 ENCOUNTER — Other Ambulatory Visit: Payer: Self-pay | Admitting: Obstetrics and Gynecology

## 2013-02-12 ENCOUNTER — Ambulatory Visit
Admission: RE | Admit: 2013-02-12 | Discharge: 2013-02-12 | Disposition: A | Discharge status: Home / Self Care | Payer: BC Managed Care – PPO | Source: Ambulatory Visit | Attending: Orthopedic Surgery | Admitting: Orthopedic Surgery

## 2013-02-12 DIAGNOSIS — M542 Cervicalgia: Secondary | ICD-10-CM

## 2013-02-16 ENCOUNTER — Encounter: Payer: Self-pay | Admitting: Neurology

## 2013-02-16 ENCOUNTER — Ambulatory Visit (INDEPENDENT_AMBULATORY_CARE_PROVIDER_SITE_OTHER): Payer: BC Managed Care – PPO | Admitting: Neurology

## 2013-02-16 VITALS — BP 136/81 | HR 85 | Temp 99.0°F | Ht 66.0 in | Wt 262.0 lb

## 2013-02-16 DIAGNOSIS — R209 Unspecified disturbances of skin sensation: Secondary | ICD-10-CM

## 2013-02-16 DIAGNOSIS — M79609 Pain in unspecified limb: Secondary | ICD-10-CM

## 2013-02-16 DIAGNOSIS — R269 Unspecified abnormalities of gait and mobility: Secondary | ICD-10-CM

## 2013-02-16 DIAGNOSIS — IMO0001 Reserved for inherently not codable concepts without codable children: Secondary | ICD-10-CM

## 2013-02-16 DIAGNOSIS — M797 Fibromyalgia: Secondary | ICD-10-CM

## 2013-02-16 MED ORDER — PREGABALIN 50 MG PO CAPS: 50.0000 mg | ORAL_CAPSULE | Freq: Three times a day (TID) | ORAL | Status: DC

## 2013-02-16 NOTE — Progress Notes (Signed)
[facility] [address] [place]. [place] [address] [phone]       OFFICE CONSULT NOTE  Laura. Chancie Lampert Date of Birth:  07/12/1966 Medical Record Number:  [id number]   Referring MD:  Lunette Stands, MD  Reason for Referral:  Gait abnormality  HPI: Laura Medina is a 47 year African American lady who's had chronic gait difficulties, leg pain and intermittent paresthesias since 2003. She states she initially started with pain on the lateral of the left knee which did not respond to Motrin. She used to walk initially with a limp and subsequently her gait became staggering and stiffness of her legs with this feeling that her legs are rubbery and may give out. She was seen by neurologist Dr. Lesia Sago. I do not have details of his office notes but apparently she underwent MRI scan of the back which was unremarkable. She subsequently saw Dr. Salli Quarry from [facility] orthopedic [facility] as well as a neurologist at [facility] for a second opinion and a third neurologist Dr.Tanuzzi at [facility] as well who did EMG nerve conduction studies which were as per the patient unremarkable. I unfortunately do not have any of these prior records to review today. She states she has undergone MRI scan of the brain and back in the past but only tests I have reviewed his MRI scan of the cervical spine from 02/12/13 which shows mild degenerative changes only which are minimally increased compared to 2009. MRI lumbar spine 10/22/12 also showed only mild degenerative changes without significant compression. Recently she has been diagnosed with fibromyalgia by Dr. Conchita Paris and tried on gabapentin which did not help and Topamax which she developed allergy to. She has not tried Lyrica recently. She complains of intermittent paresthesias involving both hands and occasionally the legs and face as well. She takes some tramadol which helps her back pain but not for long period. ROS:   14 system  review of systems is positive for leg weakness, gait difficulty, joint pain, cramps, aching muscles  PMH:  Past Medical History  Diagnosis Date  . Arthritis   . Fibromyalgia     Social History:  History   Social History  . Marital Status: Married    Spouse Name: N/A    Number of Children: N/A  . Years of Education: N/A   Occupational History  . Not on file.   Social History Main Topics  . Smoking status: Never Smoker   . Smokeless tobacco: Not on file  . Alcohol Use: No  . Drug Use: No  . Sexually Active: No   Other Topics Concern  . Not on file   Social History Narrative  . No narrative on file    Medications:   Current Outpatient Prescriptions on File Prior to Visit  Medication Sig Dispense Refill  . traMADol (ULTRAM) 50 MG tablet Take 50 mg by mouth every 6 (six) hours as needed. pain       No current facility-administered medications on file prior to visit.    Allergies:   Allergies  Allergen Reactions  . Hydrocodone Itching  . Oxycodone Itching  . Penicillins Itching   Filed Vitals:   02/16/13 1428  BP: 136/81  Pulse: 85  Temp: 99 F (37.2 C)    Physical Exam General: Obese young African American lady, seated, in no evident distress Head: head normocephalic and atraumatic. Orohparynx benign Neck: supple with no carotid or supraclavicular bruits Cardiovascular: regular rate and rhythm, no murmurs Musculoskeletal: no  deformity Skin:  no rash/petichiae Vascular:  Normal pulses all extremities  Neurologic Exam Mental Status: Awake and fully alert. Oriented to place and time. Recent and remote memory intact. Attention span, concentration and fund of knowledge appropriate. Mood and affect appropriate.  Cranial Nerves: Fundoscopic exam reveals sharp disc margins. Pupils equal, briskly reactive to light. Extraocular movements full without nystagmus. Visual fields full to confrontation. Hearing intact. Facial sensation intact. Face, tongue, palate  moves normally and symmetrically.  Motor: Normal bulk and tone. Normal strength in all tested extremity muscles. Sensory.: intact to tough and pinprick and vibratory.  Coordination: Rapid alternating movements normal in all extremities. Finger-to-nose and heel-to-shin performed accurately bilaterally. Gait and Station: Arises from chair without difficulty. Stance is slightly broad-based. Bizarre gait with shifting of weight suddenly and bobbing of her feet with apparent   stiffness of the legs however she is able to do tandem walk when prompted to do slowly. With slight distractibility   leg stiffness and coordination seem somewhat improved.  Reflexes: 1+ and symmetric. Toes downgoing.     ASSESSMENT: 47 year old lady with long-standing ten-year history of extremity pain, paresthesias as well as gait disorder with negative extensive prior imaging and neurological workup with 3 different neurological opinions. 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   PLAN:Check MRI scan of the brain and thoracic spine with and without contrast as they have not been done. Consider referral to the  Gait disorder clinic in  [facility] [place] with Dr Francene Boyers if family  is nterested. Trial of Lyrica for paresthesias and pain.

## 2013-02-16 NOTE — Patient Instructions (Addendum)
Check MRI scan of the brain and thoracic spine with and without contrast as they have not been done. Consider referral to the gait disorder clinic at [facility] in [place] if family is interested. Trial of Lyrica for paresthesias and pain.

## 2013-02-24 ENCOUNTER — Ambulatory Visit (INDEPENDENT_AMBULATORY_CARE_PROVIDER_SITE_OTHER): Payer: BC Managed Care – PPO

## 2013-02-24 DIAGNOSIS — R269 Unspecified abnormalities of gait and mobility: Secondary | ICD-10-CM

## 2013-02-24 MED ORDER — GADOPENTETATE DIMEGLUMINE 469.01 MG/ML IV SOLN: 20.0000 mL | Freq: Once | INTRAVENOUS | Status: AC | PRN

## 2013-03-01 DIAGNOSIS — R269 Unspecified abnormalities of gait and mobility: Secondary | ICD-10-CM

## 2013-03-03 ENCOUNTER — Telehealth: Payer: Self-pay | Admitting: Neurology

## 2013-03-03 NOTE — Telephone Encounter (Signed)
I called pt and LMVM for pt to return call for MRI results.

## 2013-03-03 NOTE — Telephone Encounter (Addendum)
I called and gave the MRI results to pt.  She stated that lyrica not helping, she has to take ultram with this to help.  I told her that I would send note to Dr. Pearlean Brownie and if any change will let her know.   (may need to be on longer time to assess).  She asked about being out of work and she would need to dicuss with who ever (Dr. Charlett Blake) put her out.  She asked about seeing someone local instead of [place] [facility] gait specialist).  I then noted that she has seen multiple specialists about her sx and did not think there would be anyone here locally.  She verbalized understanding.   Faxed ofv note to Dr. Charlett Blake [phone]

## 2013-03-06 NOTE — Telephone Encounter (Signed)
Ok to stay on ultram prn and increase lyrica to 150 mg twice daily. I cannot recommend ant one locally I know

## 2013-03-11 NOTE — Telephone Encounter (Signed)
I called pt and relayed that rec below and pt stated that she felt sinus pressure, head pains, and decided to stop the lyrica and stated that these sx went away.  She has seen Dr. Charlett Blake since and will f/u with her.

## 2013-03-31 ENCOUNTER — Telehealth: Payer: Self-pay

## 2013-03-31 NOTE — Telephone Encounter (Signed)
I called patient and let her know her letter for Monia Pouch was complete. She said to disregard and do not send. She stated that they would not provide her with the accommodations she needed so she is no longer working.

## 2013-10-06 ENCOUNTER — Ambulatory Visit: Payer: BC Managed Care – PPO | Attending: Internal Medicine

## 2014-04-17 ENCOUNTER — Emergency Department (HOSPITAL_COMMUNITY): Payer: BC Managed Care – PPO

## 2014-04-17 ENCOUNTER — Emergency Department (HOSPITAL_COMMUNITY)
Admission: EM | Admit: 2014-04-17 | Discharge: 2014-04-17 | Disposition: A | Discharge status: Home / Self Care | Payer: Self-pay | Attending: Emergency Medicine | Admitting: Emergency Medicine

## 2014-04-17 ENCOUNTER — Encounter (HOSPITAL_COMMUNITY): Payer: Self-pay | Admitting: Emergency Medicine

## 2014-04-17 DIAGNOSIS — M549 Dorsalgia, unspecified: Secondary | ICD-10-CM | POA: Insufficient documentation

## 2014-04-17 DIAGNOSIS — R0789 Other chest pain: Secondary | ICD-10-CM

## 2014-04-17 DIAGNOSIS — R079 Chest pain, unspecified: Secondary | ICD-10-CM | POA: Insufficient documentation

## 2014-04-17 DIAGNOSIS — Z880 Allergy status to penicillin: Secondary | ICD-10-CM | POA: Insufficient documentation

## 2014-04-17 DIAGNOSIS — IMO0001 Reserved for inherently not codable concepts without codable children: Secondary | ICD-10-CM | POA: Insufficient documentation

## 2014-04-17 DIAGNOSIS — R059 Cough, unspecified: Secondary | ICD-10-CM | POA: Insufficient documentation

## 2014-04-17 DIAGNOSIS — Z8739 Personal history of other diseases of the musculoskeletal system and connective tissue: Secondary | ICD-10-CM | POA: Insufficient documentation

## 2014-04-17 DIAGNOSIS — R071 Chest pain on breathing: Secondary | ICD-10-CM | POA: Insufficient documentation

## 2014-04-17 DIAGNOSIS — R42 Dizziness and giddiness: Secondary | ICD-10-CM | POA: Insufficient documentation

## 2014-04-17 DIAGNOSIS — Z79899 Other long term (current) drug therapy: Secondary | ICD-10-CM | POA: Insufficient documentation

## 2014-04-17 DIAGNOSIS — R05 Cough: Secondary | ICD-10-CM | POA: Insufficient documentation

## 2014-04-17 DIAGNOSIS — R110 Nausea: Secondary | ICD-10-CM | POA: Insufficient documentation

## 2014-04-17 DIAGNOSIS — K921 Melena: Secondary | ICD-10-CM | POA: Insufficient documentation

## 2014-04-17 LAB — CBC WITH DIFFERENTIAL/PLATELET
BASOS PCT: 0 % (ref 0–1)
Basophils Absolute: 0 10*3/uL (ref 0.0–0.1)
Eosinophils Absolute: 0.3 10*3/uL (ref 0.0–0.7)
Eosinophils Relative: 6 % — ABNORMAL HIGH (ref 0–5)
HCT: 40.1 % (ref 36.0–46.0)
HEMOGLOBIN: 14.1 g/dL (ref 12.0–15.0)
Lymphocytes Relative: 33 % (ref 12–46)
Lymphs Abs: 1.4 10*3/uL (ref 0.7–4.0)
MCH: 32.6 pg (ref 26.0–34.0)
MCHC: 35.2 g/dL (ref 30.0–36.0)
MCV: 92.8 fL (ref 78.0–100.0)
MONOS PCT: 10 % (ref 3–12)
Monocytes Absolute: 0.4 10*3/uL (ref 0.1–1.0)
NEUTROS ABS: 2.1 10*3/uL (ref 1.7–7.7)
NEUTROS PCT: 51 % (ref 43–77)
PLATELETS: 186 10*3/uL (ref 150–400)
RBC: 4.32 MIL/uL (ref 3.87–5.11)
RDW: 12.8 % (ref 11.5–15.5)
WBC: 4.2 10*3/uL (ref 4.0–10.5)

## 2014-04-17 LAB — BASIC METABOLIC PANEL
ANION GAP: 11 (ref 5–15)
BUN: 11 mg/dL (ref 6–23)
CHLORIDE: 103 meq/L (ref 96–112)
CO2: 24 mEq/L (ref 19–32)
Calcium: 9.2 mg/dL (ref 8.4–10.5)
Creatinine, Ser: 0.84 mg/dL (ref 0.50–1.10)
GFR, EST NON AFRICAN AMERICAN: 82 mL/min — AB (ref >90)
Glucose, Bld: 89 mg/dL (ref 70–99)
POTASSIUM: 4 meq/L (ref 3.7–5.3)
SODIUM: 138 meq/L (ref 137–147)

## 2014-04-17 LAB — I-STAT TROPONIN, ED: TROPONIN I, POC: 0 ng/mL (ref 0.00–0.08)

## 2014-04-17 LAB — POC OCCULT BLOOD, ED: FECAL OCCULT BLD: POSITIVE — AB

## 2014-04-17 MED ORDER — SODIUM CHLORIDE 0.9 % IV BOLUS (SEPSIS)
1000.0000 mL | Freq: Once | INTRAVENOUS | Status: AC
  Administered 2014-04-17: 1000 mL via INTRAVENOUS

## 2014-04-17 MED ORDER — ACETAMINOPHEN 325 MG PO TABS
650.0000 mg | ORAL_TABLET | Freq: Once | ORAL | Status: AC
  Administered 2014-04-17: 650 mg via ORAL
  Filled 2014-04-17: qty 2

## 2014-04-17 NOTE — ED Provider Notes (Signed)
CSN: [id number]     Arrival date & time 04/17/14  0756 History   First MD Initiated Contact with Patient 04/17/14 0805     Chief Complaint  Patient presents with  . Chest Pain  . Back Pain     (Consider location/radiation/quality/duration/timing/severity/associated sxs/prior Treatment) HPI Laura Medina is a 48 year old female with past medical history of arthritis and fibromyalgia who presents with left-sided back pain that radiates to her left chest. Patient states her pain began gradually on Wednesday, and has persisted until today. Patient states her pain is constant, a gnawing pain which is worse with movement, inspiration, and when she lays on her left side. Patient denies having any alleviating factors. Patient reports some mild associated "lightheadedness". Patient states she has also had some mild shortness of breath along with a nonproductive cough. Patient states her cough also exacerbates her pain. Patient denies palpitations, nausea, vomiting, abdominal pain, dysuria, vaginal pain/discharge/bleeding. Patient brings up that she has also noticed some bright red blood in her stool, which has been persistent for the past one to 2 months. Patient states she has wanted to bring this up to her PCP.   Past Medical History  Diagnosis Date  . Arthritis   . Fibromyalgia    Past Surgical History  Procedure Laterality Date  . Hemorrhoid surgery    . Blood clot evacuation      left leg  . Exploratory laparotomy    . Cholecystectomy     Family History  Problem Relation Age of Onset  . Lung cancer Paternal Grandmother    History  Substance Use Topics  . Smoking status: Never Smoker   . Smokeless tobacco: Not on file  . Alcohol Use: No   OB History   Grav Para Term Preterm Abortions TAB SAB Ect Mult Living                 Review of Systems  Constitutional: Negative for fever and fatigue.  HENT: Negative for trouble swallowing.   Eyes: Negative for visual disturbance.    Respiratory: Positive for cough and chest tightness. Negative for shortness of breath.   Cardiovascular: Negative for chest pain and palpitations.  Gastrointestinal: Positive for nausea and blood in stool. Negative for vomiting and abdominal pain.  Genitourinary: Negative for dysuria.  Musculoskeletal: Negative for neck pain.  Skin: Negative for rash.  Neurological: Positive for light-headedness. Negative for dizziness, syncope, weakness and numbness.  Psychiatric/Behavioral: Negative.       Allergies  Gabapentin; Quinine derivatives; Topamax; Hydrocodone; Oxycodone; and Penicillins  Home Medications   Prior to Admission medications   Medication Sig Start Date End Date Taking? Authorizing Provider  Cholecalciferol (VITAMIN D-3) 5000 UNITS TABS Take 5,000 mg by mouth. Once a week   Yes Historical Provider, MD  hydroxychloroquine (PLAQUENIL) 200 MG tablet Take 200 mg by mouth 2 (two) times daily.   Yes Historical Provider, MD  pregabalin (LYRICA) 50 MG capsule Take 1 capsule (50 mg total) by mouth 3 (three) times daily. 02/16/13  Yes Delia Heady, MD  traMADol (ULTRAM) 50 MG tablet Take 50 mg by mouth every 6 (six) hours as needed. pain   Yes Historical Provider, MD   BP 134/82  Pulse 80  Temp(Src) 97.3 F (36.3 C) (Oral)  Resp 15  Ht  (1.626 m)  Wt 274 lb (124.286 kg)  BMI 47.01 kg/m2  SpO2 100%  LMP 03/05/2014 Physical Exam  Constitutional: She is oriented to person, place, and time. She appears well-developed and  well-nourished. No distress.  HENT:  Head: Normocephalic and atraumatic.  Mouth/Throat: Oropharynx is clear and moist. No oropharyngeal exudate.  Eyes: EOM are normal. Pupils are equal, round, and reactive to light. Right eye exhibits no discharge. Left eye exhibits no discharge. No scleral icterus.  Neck: Normal range of motion.  Cardiovascular: Normal rate, regular rhythm and normal heart sounds.   No murmur heard. Pulmonary/Chest: Effort normal and breath  sounds normal. No accessory muscle usage. Not tachypneic. No respiratory distress. She has no wheezes. She has no rhonchi.   She exhibits tenderness. She exhibits no mass, no bony tenderness, no crepitus, no edema, no deformity and no swelling.    Abdominal: Soft. There is no tenderness. There is no rigidity, no guarding, no tenderness at McBurney's point and negative Murphy's sign.  Genitourinary:     Rectal exam: External hemorrhoid noted at 2:00 position the rectum. No frank blood noted on exam. Rectal tone normal. Small amount of stool noted in rectal vault. Chaperone present during entire rectal exam.  Musculoskeletal: Normal range of motion. She exhibits no edema and no tenderness.  Neurological: She is alert and oriented to person, place, and time. She has normal strength. No cranial nerve deficit or sensory deficit. Coordination normal. GCS eye subscore is 4. GCS verbal subscore is 5. GCS motor subscore is 6.  Skin: Skin is warm and dry. No rash noted. She is not diaphoretic.  Psychiatric: She has a normal mood and affect.    ED Course  Procedures (including critical care time) Labs Review Labs Reviewed  CBC WITH DIFFERENTIAL - Abnormal; Notable for the following:    Eosinophils Relative 6 (*)    All other components within normal limits  BASIC METABOLIC PANEL - Abnormal; Notable for the following:    GFR calc non Af Amer 82 (*)    All other components within normal limits  POC OCCULT BLOOD, ED - Abnormal; Notable for the following:    Fecal Occult Bld POSITIVE (*)    All other components within normal limits  I-STAT TROPOININ, ED  POC OCCULT BLOOD, ED    Imaging Review Dg Chest 2 View  04/17/2014   CLINICAL DATA:  Left-sided back pain radiates to the left chest. Shortness of breath, cough, nausea and dizziness.  EXAM: CHEST  2 VIEW  COMPARISON:  09/26/2009.  FINDINGS: Trachea is midline. Heart size normal. Lungs are somewhat low in volume but clear. No pleural fluid.   IMPRESSION: Lungs are somewhat low in volume but clear.   Electronically Signed   By: Leanna Battles M.D.   On: 04/17/2014 09:48     EKG Interpretation   Date/Time:  Monday April 17 2014 08:00:26 EDT Ventricular Rate:  88 PR Interval:  150 QRS Duration: 84 QT Interval:  344 QTC Calculation: 416 R Axis:   50 Text Interpretation:  Normal sinus rhythm Cannot rule out Anterior infarct  , age undetermined no significant change since 2011 Confirmed by GOLDSTON   MD, SCOTT ([id number]) on 04/17/2014 8:10:16 AM      MDM   Final diagnoses:  Chest wall pain    Patient with 5 days of left-sided back pain radiating to her left chest. Pain is reproducible along the entire left side of her back along with her left rib cage axillary, and anteriorly. PERC criteria negative.  With patient's pain being tender to the touch, and reproducible with movement, palpation, coughing, and being tender around her entire left side of her rib cage, I am less  concerned about a cardiac or pulmonary etiology, however we will rule the two out since her pain seems to be atypical.   11:20 AM: Hemoccult test positive.    After fluids, Tylenol, patient states she is feeling better. Patient with moderate size hemorrhoid on rectal exam, Hemoccult-positive thought to be possibly due to this. Patient not anemic, no leukocytosis, labs otherwise unremarkable. Troponin negative, EKG within normal limits, chest x-ray negative for any cardiopulmonary disease. Patient stating her lightheadedness has subsided after fluid bolus. Orthostatic vital signs negative. With patient being asymptomatic, and workup unremarkable, we will discharge patient. I encouraged patient to follow up with her primary care physician regarding this chest wall pain. I also encouraged her to followup with her primary care physician regarding the rectal bleed she has been experiencing. I encouraged patient to call or return to the ER should her symptoms persist,  worsen, change or should she have any questions or concerns.  BP 134/82  Pulse 80  Temp(Src) 97.3 F (36.3 C) (Oral)  Resp 15  Ht  (1.626 m)  Wt 274 lb (124.286 kg)  BMI 47.01 kg/m2  SpO2 100%  LMP 03/05/2014   Signed,  Ladona Mow, PA-C 8:46 AM   This patient seen and discussed with Dr. Pricilla Loveless, M.D.   Monte Fantasia, PA-C 04/19/14 [phone]

## 2014-04-17 NOTE — ED Notes (Signed)
Pt placed in Gown and socks, Hooked up to 12Lead, BP and Pulse Ox.

## 2014-04-17 NOTE — Discharge Instructions (Signed)
You may use Tylenol every 4-6 hours as needed for your pain. Followup with your primary care doctor regarding your bloody stools. Return to the ER if your pain worsens, or if you develop any fever, shortness of breath, dizziness, weakness or if you have any questions or concerns.  Chest Wall Pain Chest wall pain is pain in or around the bones and muscles of your chest. It may take up to 6 weeks to get better. It may take longer if you must stay physically active in your work and activities.  CAUSES  Chest wall pain may happen on its own. However, it may be caused by:  A viral illness like the flu.  Injury.  Coughing.  Exercise.  Arthritis.  Fibromyalgia.  Shingles. HOME CARE INSTRUCTIONS   Avoid overtiring physical activity. Try not to strain or perform activities that cause pain. This includes any activities using your chest or your abdominal and side muscles, especially if heavy weights are used.  Put ice on the sore area.  Put ice in a plastic bag.  Place a towel between your skin and the bag.  Leave the ice on for 15-20 minutes per hour while awake for the first 2 days.  Only take over-the-counter or prescription medicines for pain, discomfort, or fever as directed by your caregiver. SEEK IMMEDIATE MEDICAL CARE IF:   Your pain increases, or you are very uncomfortable.  You have a fever.  Your chest pain becomes worse.  You have new, unexplained symptoms.  You have nausea or vomiting.  You feel sweaty or lightheaded.  You have a cough with phlegm (sputum), or you cough up blood. MAKE SURE YOU:   Understand these instructions.  Will watch your condition.  Will get help right away if you are not doing well or get worse. Document Released: 07/14/2005 Document Revised: 10/06/2011 Document Reviewed: 03/10/2011 [facility] Patient Information 2015 [place], [place]. This information is not intended to replace advice given to you by your health care provider. Make sure  you discuss any questions you have with your health care provider.

## 2014-04-17 NOTE — ED Notes (Signed)
Pt c/o left sided back pain that radiates around to left chest onset Wednesday. Pt denies injury. Pt reports shortness of breath, cough, nausea and dizziness.

## 2014-04-17 NOTE — ED Notes (Signed)
Family at bedside. 

## 2014-04-17 NOTE — ED Notes (Signed)
Dr. goldston at the bedside 

## 2014-04-17 NOTE — ED Notes (Signed)
Patient transported to X-ray 

## 2014-04-17 NOTE — ED Notes (Signed)
Pt returned from xray

## 2014-04-17 NOTE — ED Notes (Signed)
Placed back on the monitor. 

## 2014-04-17 NOTE — ED Notes (Signed)
Jomarie Longs, PA at the bedside.

## 2014-04-18 NOTE — Discharge Planning (Signed)
[facility] Community Liaison  Spoke to the patient regarding primary care resources and establishing care with a provider. Patient states she utlizlies a local urgent care as her pcp. Orange card application and instructions given. Resource guide and my contact information also provided for any future questions or concerns. No other community liaison needs identified at this time.

## 2014-04-19 NOTE — ED Provider Notes (Signed)
Medical screening examination/treatment/procedure(s) were conducted as a shared visit with non-physician practitioner(s) and myself.  I personally evaluated the patient during the encounter.   EKG Interpretation   Date/Time:  Monday April 17 2014 08:00:26 EDT Ventricular Rate:  88 PR Interval:  150 QRS Duration: 84 QT Interval:  344 QTC Calculation: 416 R Axis:   50 Text Interpretation:  Normal sinus rhythm Cannot rule out Anterior infarct  , age undetermined no significant change since 2011 Confirmed by Taralee Marcus   MD, Lashonda Sonneborn ([id number]) on 04/17/2014 8:10:16 AM       Patient with atypical CP that appears to be MSK. Low risk for ACS based on history and exam. PERC negative and low risk for PE. CXR benign. Likely URI related with cough. Will d/c home  Audree Camel, MD 04/19/14 [phone]

## 2014-08-04 ENCOUNTER — Other Ambulatory Visit: Payer: Self-pay | Admitting: Obstetrics and Gynecology

## 2014-08-07 LAB — CYTOLOGY - PAP

## 2015-08-06 ENCOUNTER — Emergency Department (HOSPITAL_COMMUNITY)
Admission: EM | Admit: 2015-08-06 | Discharge: 2015-08-06 | Disposition: A | Discharge status: Home / Self Care | Payer: BLUE CROSS/BLUE SHIELD | Attending: Emergency Medicine | Admitting: Emergency Medicine

## 2015-08-06 ENCOUNTER — Encounter (HOSPITAL_COMMUNITY): Payer: Self-pay | Admitting: Emergency Medicine

## 2015-08-06 DIAGNOSIS — Y998 Other external cause status: Secondary | ICD-10-CM | POA: Diagnosis not present

## 2015-08-06 DIAGNOSIS — Y9289 Other specified places as the place of occurrence of the external cause: Secondary | ICD-10-CM | POA: Insufficient documentation

## 2015-08-06 DIAGNOSIS — Z79899 Other long term (current) drug therapy: Secondary | ICD-10-CM | POA: Insufficient documentation

## 2015-08-06 DIAGNOSIS — W000XXA Fall on same level due to ice and snow, initial encounter: Secondary | ICD-10-CM | POA: Diagnosis not present

## 2015-08-06 DIAGNOSIS — Z880 Allergy status to penicillin: Secondary | ICD-10-CM | POA: Insufficient documentation

## 2015-08-06 DIAGNOSIS — Y9389 Activity, other specified: Secondary | ICD-10-CM | POA: Insufficient documentation

## 2015-08-06 DIAGNOSIS — S060X0A Concussion without loss of consciousness, initial encounter: Secondary | ICD-10-CM | POA: Diagnosis not present

## 2015-08-06 DIAGNOSIS — S199XXA Unspecified injury of neck, initial encounter: Secondary | ICD-10-CM | POA: Insufficient documentation

## 2015-08-06 DIAGNOSIS — S4992XA Unspecified injury of left shoulder and upper arm, initial encounter: Secondary | ICD-10-CM | POA: Insufficient documentation

## 2015-08-06 DIAGNOSIS — S4991XA Unspecified injury of right shoulder and upper arm, initial encounter: Secondary | ICD-10-CM | POA: Insufficient documentation

## 2015-08-06 DIAGNOSIS — S29002A Unspecified injury of muscle and tendon of back wall of thorax, initial encounter: Secondary | ICD-10-CM | POA: Diagnosis not present

## 2015-08-06 DIAGNOSIS — S0990XA Unspecified injury of head, initial encounter: Secondary | ICD-10-CM | POA: Diagnosis present

## 2015-08-06 DIAGNOSIS — M797 Fibromyalgia: Secondary | ICD-10-CM | POA: Insufficient documentation

## 2015-08-06 DIAGNOSIS — W19XXXA Unspecified fall, initial encounter: Secondary | ICD-10-CM

## 2015-08-06 MED ORDER — ONDANSETRON HCL 4 MG PO TABS: 4.0000 mg | ORAL_TABLET | Freq: Four times a day (QID) | ORAL | Status: DC

## 2015-08-06 MED ORDER — ONDANSETRON 8 MG PO TBDP
8.0000 mg | ORAL_TABLET | Freq: Once | ORAL | Status: AC
  Administered 2015-08-06: 8 mg via ORAL
  Filled 2015-08-06: qty 1

## 2015-08-06 NOTE — ED Notes (Signed)
Patient presents for fall. Reports slipping on ice approximately 0900 today. Denies LOC, denies anticoagulants, denies double or blurred vision. C/o HA, dizziness, upper back pain, posterior neck pain. Rates pain 10/10. 200mg  ibuprofen approximately 0915.

## 2015-08-06 NOTE — Discharge Instructions (Signed)
Concussion, Adult  A concussion, or closed-head injury, is a brain injury caused by a direct blow to the head or by a quick and sudden movement (jolt) of the head or neck. Concussions are usually not life-threatening. Even so, the effects of a concussion can be serious. If you have had a concussion before, you are more likely to experience concussion-like symptoms after a direct blow to the head.   CAUSES  · Direct blow to the head, such as from running into another player during a soccer game, being hit in a fight, or hitting your head on a hard surface.  · A jolt of the head or neck that causes the brain to move back and forth inside the skull, such as in a car crash.  SIGNS AND SYMPTOMS  The signs of a concussion can be hard to notice. Early on, they may be missed by you, family members, and health care providers. You may look fine but act or feel differently.  Symptoms are usually temporary, but they may last for days, weeks, or even longer. Some symptoms may appear right away while others may not show up for hours or days. Every head injury is different. Symptoms include:  · Mild to moderate headaches that will not go away.  · A feeling of pressure inside your head.  · Having more trouble than usual:    Learning or remembering things you have heard.    Answering questions.    Paying attention or concentrating.    Organizing daily tasks.    Making decisions and solving problems.  · Slowness in thinking, acting or reacting, speaking, or reading.  · Getting lost or being easily confused.  · Feeling tired all the time or lacking energy (fatigued).  · Feeling drowsy.  · Sleep disturbances.    Sleeping more than usual.    Sleeping less than usual.    Trouble falling asleep.    Trouble sleeping (insomnia).  · Loss of balance or feeling lightheaded or dizzy.  · Nausea or vomiting.  · Numbness or tingling.  · Increased sensitivity to:    Sounds.    Lights.    Distractions.  · Vision problems or eyes that tire  easily.  · Diminished sense of taste or smell.  · Ringing in the ears.  · Mood changes such as feeling sad or anxious.  · Becoming easily irritated or angry for little or no reason.  · Lack of motivation.  · Seeing or hearing things other people do not see or hear (hallucinations).  DIAGNOSIS  Your health care provider can usually diagnose a concussion based on a description of your injury and symptoms. He or she will ask whether you passed out (lost consciousness) and whether you are having trouble remembering events that happened right before and during your injury.  Your evaluation might include:  · A brain scan to look for signs of injury to the brain. Even if the test shows no injury, you may still have a concussion.  · Blood tests to be sure other problems are not present.  TREATMENT  · Concussions are usually treated in an emergency department, in urgent care, or at a clinic. You may need to stay in the hospital overnight for further treatment.  · Tell your health care provider if you are taking any medicines, including prescription medicines, over-the-counter medicines, and natural remedies. Some medicines, such as blood thinners (anticoagulants) and aspirin, may increase the chance of complications. Also tell your health care   provider whether you have had alcohol or are taking illegal drugs. This information may affect treatment.  · Your health care provider will send you home with important instructions to follow.  · How fast you will recover from a concussion depends on many factors. These factors include how severe your concussion is, what part of your brain was injured, your age, and how healthy you were before the concussion.  · Most people with mild injuries recover fully. Recovery can take time. In general, recovery is slower in older persons. Also, persons who have had a concussion in the past or have other medical problems may find that it takes longer to recover from their current injury.  HOME  CARE INSTRUCTIONS  General Instructions  · Carefully follow the directions your health care provider gave you.  · Only take over-the-counter or prescription medicines for pain, discomfort, or fever as directed by your health care provider.  · Take only those medicines that your health care provider has approved.  · Do not drink alcohol until your health care provider says you are well enough to do so. Alcohol and certain other drugs may slow your recovery and can put you at risk of further injury.  · If it is harder than usual to remember things, write them down.  · If you are easily distracted, try to do one thing at a time. For example, do not try to watch TV while fixing dinner.  · Talk with family members or close friends when making important decisions.  · Keep all follow-up appointments. Repeated evaluation of your symptoms is recommended for your recovery.  · Watch your symptoms and tell others to do the same. Complications sometimes occur after a concussion. Older adults with a brain injury may have a higher risk of serious complications, such as a blood clot on the brain.  · Tell your teachers, school nurse, school counselor, coach, athletic trainer, or work manager about your injury, symptoms, and restrictions. Tell them about what you can or cannot do. They should watch for:    Increased problems with attention or concentration.    Increased difficulty remembering or learning new information.    Increased time needed to complete tasks or assignments.    Increased irritability or decreased ability to cope with stress.    Increased symptoms.  · Rest. Rest helps the brain to heal. Make sure you:    Get plenty of sleep at night. Avoid staying up late at night.    Keep the same bedtime hours on weekends and weekdays.    Rest during the day. Take daytime naps or rest breaks when you feel tired.  · Limit activities that require a lot of thought or concentration. These include:    Doing homework or job-related  work.    Watching TV.    Working on the computer.  · Avoid any situation where there is potential for another head injury (football, hockey, soccer, basketball, martial arts, downhill snow sports and horseback riding). Your condition will get worse every time you experience a concussion. You should avoid these activities until you are evaluated by the appropriate follow-up health care providers.  Returning To Your Regular Activities  You will need to return to your normal activities slowly, not all at once. You must give your body and brain enough time for recovery.  · Do not return to sports or other athletic activities until your health care provider tells you it is safe to do so.  · Ask   your health care provider when you can drive, ride a bicycle, or operate heavy machinery. Your ability to react may be slower after a brain injury. Never do these activities if you are dizzy.  · Ask your health care provider about when you can return to work or school.  Preventing Another Concussion  It is very important to avoid another brain injury, especially before you have recovered. In rare cases, another injury can lead to permanent brain damage, brain swelling, or death. The risk of this is greatest during the first 7-10 days after a head injury. Avoid injuries by:  · Wearing a seat belt when riding in a car.  · Drinking alcohol only in moderation.  · Wearing a helmet when biking, skiing, skateboarding, skating, or doing similar activities.  · Avoiding activities that could lead to a second concussion, such as contact or recreational sports, until your health care provider says it is okay.  · Taking safety measures in your home.    Remove clutter and tripping hazards from floors and stairways.    Use grab bars in bathrooms and handrails by stairs.    Place non-slip mats on floors and in bathtubs.    Improve lighting in dim areas.  SEEK MEDICAL CARE IF:  · You have increased problems paying attention or  concentrating.  · You have increased difficulty remembering or learning new information.  · You need more time to complete tasks or assignments than before.  · You have increased irritability or decreased ability to cope with stress.  · You have more symptoms than before.  Seek medical care if you have any of the following symptoms for more than 2 weeks after your injury:  · Lasting (chronic) headaches.  · Dizziness or balance problems.  · Nausea.  · Vision problems.  · Increased sensitivity to noise or light.  · Depression or mood swings.  · Anxiety or irritability.  · Memory problems.  · Difficulty concentrating or paying attention.  · Sleep problems.  · Feeling tired all the time.  SEEK IMMEDIATE MEDICAL CARE IF:  · You have severe or worsening headaches. These may be a sign of a blood clot in the brain.  · You have weakness (even if only in one hand, leg, or part of the face).  · You have numbness.  · You have decreased coordination.  · You vomit repeatedly.  · You have increased sleepiness.  · One pupil is larger than the other.  · You have convulsions.  · You have slurred speech.  · You have increased confusion. This may be a sign of a blood clot in the brain.  · You have increased restlessness, agitation, or irritability.  · You are unable to recognize people or places.  · You have neck pain.  · It is difficult to wake you up.  · You have unusual behavior changes.  · You lose consciousness.  MAKE SURE YOU:  · Understand these instructions.  · Will watch your condition.  · Will get help right away if you are not doing well or get worse.     This information is not intended to replace advice given to you by your health care provider. Make sure you discuss any questions you have with your health care provider.     Document Released: 10/04/2003 Document Revised: 08/04/2014 Document Reviewed: 02/03/2013  Elsevier Interactive Patient Education ©2016 [employer]

## 2015-08-06 NOTE — ED Provider Notes (Signed)
CSN: [id number]     Arrival date & time 08/06/15  1139 History  By signing my name below, I, Soijett Blue, attest that this documentation has been prepared under the direction and in the presence of Ivar Drapeob Runa Whittingham, [employer]F CorporationPA-C Electronically Signed: Soijett Blue, ED Scribe. 08/06/2015. 12:11 PM.   Chief Complaint  Patient presents with  . Fall  . Head Injury      The history is provided by the patient. No language interpreter was used.    Laura Medina is a 50 y.o. female with a medical hx of arthritis and fibromyalgia, who presents to the Emergency Department complaining of a fall onset 9 AM PTA. Pt notes that she slipped and fell straight back on ice while ambulating into her house. Pt is having associated symptoms of hitting her head, HA, dizziness, upper back pain, neck pain, nausea, and bilateral arm soreness. She rates all of her asscoaited symptoms as 10/10 at this time. She notes that she has tried OTC 200 mg Ibuprofen at 9:15 AM with no relief of her symptoms. She denies LOC, vision change, blurred vision, vomiting, weakness, and any other symptoms. Denies using blood thinners at this time. She notes that she is allergic to hydrocodone, oxycodone, and morphine at this time.   Past Medical History  Diagnosis Date  . Arthritis   . Fibromyalgia    Past Surgical History  Procedure Laterality Date  . Hemorrhoid surgery    . Blood clot evacuation      left leg  . Exploratory laparotomy    . Cholecystectomy     Family History  Problem Relation Age of Onset  . Lung cancer Paternal Grandmother    Social History  Substance Use Topics  . Smoking status: Never Smoker   . Smokeless tobacco: None  . Alcohol Use: No   OB History    No data available     Review of Systems  Eyes: Negative for visual disturbance.  Gastrointestinal: Positive for nausea. Negative for vomiting.  Musculoskeletal: Positive for myalgias, back pain and neck pain.  Skin: Negative for color change and wound.   Neurological: Positive for dizziness and headaches. Negative for syncope and weakness.      Allergies  Gabapentin; Quinine derivatives; Topamax; Hydrocodone; Oxycodone; and Penicillins  Home Medications   Prior to Admission medications   Medication Sig Start Date End Date Taking? Authorizing Provider  Cholecalciferol (VITAMIN D-3) 5000 UNITS TABS Take 5,000 mg by mouth. Once a week    Historical Provider, MD  hydroxychloroquine (PLAQUENIL) 200 MG tablet Take 200 mg by mouth 2 (two) times daily.    Historical Provider, MD  pregabalin (LYRICA) 50 MG capsule Take 1 capsule (50 mg total) by mouth 3 (three) times daily. 02/16/13   Micki RileyPramod S Sethi, MD  traMADol (ULTRAM) 50 MG tablet Take 50 mg by mouth every 6 (six) hours as needed. pain    Historical Provider, MD   BP 144/90 mmHg  Pulse 84  Temp(Src) 98 F (36.7 C) (Oral)  Resp 16  SpO2 100%  LMP 07/07/2015 Physical Exam  Constitutional: She is oriented to person, place, and time. She appears well-developed and well-nourished. No distress.  HENT:  Head: Normocephalic and atraumatic.  Right Ear: External ear normal.  Left Ear: External ear normal.  Eyes: Conjunctivae and EOM are normal. Pupils are equal, round, and reactive to light.  Neck: Normal range of motion. Neck supple.  No pain with neck flexion, no meningismus Paraspinal muscle tenderness No bony C-spine tenderness  Cardiovascular: Normal rate, regular rhythm and normal heart sounds.  Exam reveals no gallop and no friction rub.   No murmur heard. Pulmonary/Chest: Effort normal and breath sounds normal. No respiratory distress. She has no wheezes. She has no rales. She exhibits no tenderness.  Abdominal: Soft. She exhibits no distension and no mass. There is no tenderness. There is no rebound and no guarding.  Musculoskeletal: Normal range of motion. She exhibits no edema or tenderness.  Normal gait.  Neurological: She is alert and oriented to person, place, and time. She  has normal reflexes.  CN 3-12 intact, normal finger to nose, no pronator drift, sensation and strength intact bilaterally.  Skin: Skin is warm and dry.  Psychiatric: She has a normal mood and affect. Her behavior is normal. Judgment and thought content normal.  Nursing note and vitals reviewed.    ED Course  Procedures (including critical care time)   MDM   Final diagnoses:  Fall, initial encounter  Concussion, without loss of consciousness, initial encounter    Patient with fall. She hit her head. Feels slightly nauseated, but no LOC, no vomiting, no vision changes. CT head not indicated per [place] head CT rules, C-spine cleared with neck is criteria. Patient given her cautions regarding concussion. Recommend primary care follow-up or return to emergency department for new or worsening symptoms.  I personally performed the services described in this documentation, which was scribed in my presence. The recorded information has been reviewed and is accurate.      Roxy Horseman, PA-C 08/06/15 1217  Doug Sou, MD 08/06/15 1524

## 2015-08-07 ENCOUNTER — Emergency Department (HOSPITAL_COMMUNITY)
Admission: EM | Admit: 2015-08-07 | Discharge: 2015-08-07 | Disposition: A | Discharge status: Home / Self Care | Payer: BLUE CROSS/BLUE SHIELD | Attending: Emergency Medicine | Admitting: Emergency Medicine

## 2015-08-07 ENCOUNTER — Emergency Department (HOSPITAL_COMMUNITY): Payer: BLUE CROSS/BLUE SHIELD

## 2015-08-07 ENCOUNTER — Encounter (HOSPITAL_COMMUNITY): Payer: Self-pay

## 2015-08-07 DIAGNOSIS — Z880 Allergy status to penicillin: Secondary | ICD-10-CM | POA: Insufficient documentation

## 2015-08-07 DIAGNOSIS — Z79899 Other long term (current) drug therapy: Secondary | ICD-10-CM | POA: Diagnosis not present

## 2015-08-07 DIAGNOSIS — S29001A Unspecified injury of muscle and tendon of front wall of thorax, initial encounter: Secondary | ICD-10-CM | POA: Insufficient documentation

## 2015-08-07 DIAGNOSIS — Y9389 Activity, other specified: Secondary | ICD-10-CM | POA: Insufficient documentation

## 2015-08-07 DIAGNOSIS — M791 Myalgia, unspecified site: Secondary | ICD-10-CM

## 2015-08-07 DIAGNOSIS — W000XXA Fall on same level due to ice and snow, initial encounter: Secondary | ICD-10-CM | POA: Insufficient documentation

## 2015-08-07 DIAGNOSIS — Y9289 Other specified places as the place of occurrence of the external cause: Secondary | ICD-10-CM | POA: Diagnosis not present

## 2015-08-07 DIAGNOSIS — Y998 Other external cause status: Secondary | ICD-10-CM | POA: Insufficient documentation

## 2015-08-07 DIAGNOSIS — S161XXA Strain of muscle, fascia and tendon at neck level, initial encounter: Secondary | ICD-10-CM | POA: Insufficient documentation

## 2015-08-07 DIAGNOSIS — M797 Fibromyalgia: Secondary | ICD-10-CM | POA: Diagnosis not present

## 2015-08-07 DIAGNOSIS — S199XXA Unspecified injury of neck, initial encounter: Secondary | ICD-10-CM | POA: Diagnosis present

## 2015-08-07 DIAGNOSIS — R0789 Other chest pain: Secondary | ICD-10-CM

## 2015-08-07 LAB — I-STAT TROPONIN, ED: Troponin i, poc: 0 ng/mL (ref 0.00–0.08)

## 2015-08-07 LAB — BASIC METABOLIC PANEL
Anion gap: 7 (ref 5–15)
BUN: 13 mg/dL (ref 6–20)
CO2: 24 mmol/L (ref 22–32)
Calcium: 8.9 mg/dL (ref 8.9–10.3)
Chloride: 106 mmol/L (ref 101–111)
Creatinine, Ser: 0.93 mg/dL (ref 0.44–1.00)
GFR calc Af Amer: >60 mL/min (ref >60)
GFR calc non Af Amer: >60 mL/min (ref >60)
GLUCOSE: 88 mg/dL (ref 65–99)
POTASSIUM: 4.2 mmol/L (ref 3.5–5.1)
Sodium: 137 mmol/L (ref 135–145)

## 2015-08-07 LAB — CBC
HEMATOCRIT: 43.6 % (ref 36.0–46.0)
Hemoglobin: 14.5 g/dL (ref 12.0–15.0)
MCH: 32.2 pg (ref 26.0–34.0)
MCHC: 33.3 g/dL (ref 30.0–36.0)
MCV: 96.9 fL (ref 78.0–100.0)
Platelets: 253 10*3/uL (ref 150–400)
RBC: 4.5 MIL/uL (ref 3.87–5.11)
RDW: 13.2 % (ref 11.5–15.5)
WBC: 3.8 10*3/uL — ABNORMAL LOW (ref 4.0–10.5)

## 2015-08-07 MED ORDER — IPRATROPIUM BROMIDE 0.02 % IN SOLN
0.5000 mg | Freq: Once | RESPIRATORY_TRACT | Status: AC
  Administered 2015-08-07: 0.5 mg via RESPIRATORY_TRACT
  Filled 2015-08-07: qty 2.5

## 2015-08-07 MED ORDER — DIAZEPAM 5 MG PO TABS
5.0000 mg | ORAL_TABLET | Freq: Once | ORAL | Status: AC
  Administered 2015-08-07: 5 mg via ORAL
  Filled 2015-08-07: qty 1

## 2015-08-07 MED ORDER — ALBUTEROL SULFATE (2.5 MG/3ML) 0.083% IN NEBU
5.0000 mg | INHALATION_SOLUTION | Freq: Once | RESPIRATORY_TRACT | Status: AC
  Administered 2015-08-07: 5 mg via RESPIRATORY_TRACT
  Filled 2015-08-07: qty 6

## 2015-08-07 NOTE — ED Notes (Signed)
Pt here yesterday d/t fall from ice. Dx with concussion.  Pt sent home without pain meds.  Pain is worse today.  Pt using ice and heat without relief.

## 2015-08-07 NOTE — ED Provider Notes (Signed)
History  By signing my name below, I, Laura Medina, attest that this documentation has been prepared under the direction and in the presence of Rob Kendall, [place]. Electronically Signed: Karle Medina, ED Scribe. 08/07/2015. 3:12 PM  Chief Complaint  Patient presents with  . Fall  . Neck Pain   The history is provided by the patient and medical records. No language interpreter was used.    HPI Comments:  Laura Medina is a 50 y.o. obese female who presents to the Emergency Department complaining of worsening neck pain and tightness that began yesterday secondary to slipping and falling on ice. She was seen here yesterday for the fall and was diagnosed with a concussion and instructed to take the Flexeril and Tramadol she already had at home (she is allergic to most pain medications). She states that this morning when she awoke she began having anterior and centralized chest wall pain that increases with deep breathing. This was not present yesterday.  She reports mild sore throat. She reports using heat and ice therapy with no significant relief of the pain. She has also taken Ibuprofen without successful relief of the symptoms. She denies alleviating factors. She denies nausea, vomiting, abdominal pain, bruising, wounds, numbness, tingling or weakness of any extremity. She denies any previous MI.  Past Medical History  Diagnosis Date  . Arthritis   . Fibromyalgia    Past Surgical History  Procedure Laterality Date  . Hemorrhoid surgery    . Blood clot evacuation      left leg  . Exploratory laparotomy    . Cholecystectomy     Family History  Problem Relation Age of Onset  . Lung cancer Paternal Grandmother    Social History  Substance Use Topics  . Smoking status: Never Smoker   . Smokeless tobacco: None  . Alcohol Use: No   OB History    No data available     Review of Systems  HENT: Positive for sore throat.   Gastrointestinal: Negative for nausea, vomiting  and abdominal pain.  Musculoskeletal: Positive for myalgias and neck pain.  Skin: Negative for color change and wound.  Neurological: Negative for weakness and numbness.    Allergies  Gabapentin; Quinine derivatives; Topamax; Hydrocodone; Oxycodone; and Penicillins  Home Medications   Prior to Admission medications   Medication Sig Start Date End Date Taking? Authorizing Provider  Cholecalciferol (VITAMIN D-3) 5000 UNITS TABS Take 5,000 mg by mouth. Once a week    Historical Provider, MD  hydroxychloroquine (PLAQUENIL) 200 MG tablet Take 200 mg by mouth 2 (two) times daily.    Historical Provider, MD  ondansetron (ZOFRAN) 4 MG tablet Take 1 tablet (4 mg total) by mouth every 6 (six) hours. 08/06/15   Roxy Horseman, PA-C  pregabalin (LYRICA) 50 MG capsule Take 1 capsule (50 mg total) by mouth 3 (three) times daily. 02/16/13   Micki Riley, MD  traMADol (ULTRAM) 50 MG tablet Take 50 mg by mouth every 6 (six) hours as needed. pain    Historical Provider, MD   Triage Vitals: BP 140/85 mmHg  Pulse 84  Temp(Src) 98.2 F (36.8 C) (Oral)  Resp 18  SpO2 100%  LMP 07/07/2015 Physical Exam  Constitutional: She is oriented to person, place, and time. She appears well-developed and well-nourished. No distress.  HENT:  Head: Normocephalic and atraumatic.  Oropharynx is clear, no exudates, no abscess  Eyes: Conjunctivae and EOM are normal. Right eye exhibits no discharge. Left eye exhibits no discharge. No scleral  icterus.  Neck: Normal range of motion. Neck supple. No tracheal deviation present.  Cardiovascular: Normal rate, regular rhythm and normal heart sounds.  Exam reveals no gallop and no friction rub.   No murmur heard. Pulmonary/Chest: Effort normal. No respiratory distress. She has wheezes. She exhibits tenderness.  Left lower lobe wheeze Anterior chest wall ttp No contusions or bony abnormality  Abdominal: Soft. She exhibits no distension. There is no tenderness.   Musculoskeletal: Normal range of motion.  Cervical paraspinal muscles more tender to palpation today, ttp of SCMs bilaterally, no bony tenderness, step-offs, or gross abnormality or deformity of spine, patient is able to ambulate, moves all extremities  Bilateral great toe extension intact Bilateral plantar/dorsiflexion intact  Neurological: She is alert and oriented to person, place, and time.  Sensation and strength intact bilaterally   Skin: Skin is warm and dry. She is not diaphoretic.  Psychiatric: She has a normal mood and affect. Her behavior is normal. Judgment and thought content normal.  Nursing note and vitals reviewed.   ED Course  Procedures (including critical care time) DIAGNOSTIC STUDIES: Oxygen Saturation is 100% on RA, normal by my interpretation.   COORDINATION OF CARE: 1:08 PM- Will order CXR, C-spine X-ray and nebulizer treatment. Will give dose of PO Valium prior to imaging and order soft collar for comfort and support. Pt verbalizes understanding and agrees to plan.  Medications  albuterol (PROVENTIL) (2.5 MG/3ML) 0.083% nebulizer solution 5 mg (5 mg Nebulization Given 08/07/15 1412)  ipratropium (ATROVENT) nebulizer solution 0.5 mg (0.5 mg Nebulization Given 08/07/15 1413)  diazepam (VALIUM) tablet 5 mg (5 mg Oral Given 08/07/15 1413)   Results for orders placed or performed during the hospital encounter of 08/07/15  CBC  Result Value Ref Range   WBC 3.8 (L) 4.0 - 10.5 K/uL   RBC 4.50 3.87 - 5.11 MIL/uL   Hemoglobin 14.5 12.0 - 15.0 g/dL   HCT 72.543.6 36.636.0 - 44.046.0 %   MCV 96.9 78.0 - 100.0 fL   MCH 32.2 26.0 - 34.0 pg   MCHC 33.3 30.0 - 36.0 g/dL   RDW 34.713.2 42.511.5 - 95.615.5 %   Platelets 253 150 - 400 K/uL  Basic metabolic panel  Result Value Ref Range   Sodium 137 135 - 145 mmol/L   Potassium 4.2 3.5 - 5.1 mmol/L   Chloride 106 101 - 111 mmol/L   CO2 24 22 - 32 mmol/L   Glucose, Bld 88 65 - 99 mg/dL   BUN 13 6 - 20 mg/dL   Creatinine, Ser 3.870.93 0.44 - 1.00  mg/dL   Calcium 8.9 8.9 - 56.410.3 mg/dL   GFR calc non Af Amer >60 >60 mL/min   GFR calc Af Amer >60 >60 mL/min   Anion gap 7 5 - 15  I-Stat Troponin, ED [facility])  Result Value Ref Range   Troponin i, poc 0.00 0.00 - 0.08 ng/mL   Comment 3           Dg Chest 2 View  08/07/2015  CLINICAL DATA:  Chest pain, shortness of breath EXAM: CHEST  2 VIEW COMPARISON:  04/17/2014 FINDINGS: The heart size and mediastinal contours are within normal limits. Both lungs are clear. The visualized skeletal structures are unremarkable. IMPRESSION: No active cardiopulmonary disease. Electronically Signed   By: Elige KoHetal  Patel   On: 08/07/2015 13:41   Ct Cervical Spine Wo Contrast  08/07/2015  CLINICAL DATA:  Status post fall.  Whiplash injury.  Neck pain. EXAM: CT CERVICAL  SPINE WITHOUT CONTRAST TECHNIQUE: Multidetector CT imaging of the cervical spine was performed without intravenous contrast. Multiplanar CT image reconstructions were also generated. COMPARISON:  MR cervical spine 02/12/2013 FINDINGS: The alignment is anatomic. The vertebral body heights are maintained. There is no acute fracture. There is no static listhesis. The prevertebral soft tissues are normal. The intraspinal soft tissues are not fully imaged on this examination due to poor soft tissue contrast, but there is no gross soft tissue abnormality. The disc spaces are maintained. The visualized portions of the lung apices demonstrate no focal abnormality. IMPRESSION: No acute osseous injury of the cervical spine. Electronically Signed   By: Elige Ko   On: 08/07/2015 14:39   Imaging Review Dg Chest 2 View  08/07/2015  CLINICAL DATA:  Chest pain, shortness of breath EXAM: CHEST  2 VIEW COMPARISON:  04/17/2014 FINDINGS: The heart size and mediastinal contours are within normal limits. Both lungs are clear. The visualized skeletal structures are unremarkable. IMPRESSION: No active cardiopulmonary disease. Electronically Signed   By: Elige Ko   On:  08/07/2015 13:41   Ct Cervical Spine Wo Contrast  08/07/2015  CLINICAL DATA:  Status post fall.  Whiplash injury.  Neck pain. EXAM: CT CERVICAL SPINE WITHOUT CONTRAST TECHNIQUE: Multidetector CT imaging of the cervical spine was performed without intravenous contrast. Multiplanar CT image reconstructions were also generated. COMPARISON:  MR cervical spine 02/12/2013 FINDINGS: The alignment is anatomic. The vertebral body heights are maintained. There is no acute fracture. There is no static listhesis. The prevertebral soft tissues are normal. The intraspinal soft tissues are not fully imaged on this examination due to poor soft tissue contrast, but there is no gross soft tissue abnormality. The disc spaces are maintained. The visualized portions of the lung apices demonstrate no focal abnormality. IMPRESSION: No acute osseous injury of the cervical spine. Electronically Signed   By: Elige Ko   On: 08/07/2015 14:39     EKG Interpretation  Date/Time:  Tuesday August 07 2015 13:51:13 EST Ventricular Rate:  83 PR Interval:  139 QRS Duration: 89 QT Interval:  342 QTC Calculation: 402 R Axis:   48 Text Interpretation:  Sinus rhythm Atrial premature complex No significant change since last tracing Confirmed by Denton Lank  MD, Caryn Bee ([id number]) on 08/07/2015 3:00:20 PM      I have personally reviewed and evaluated these images and lab results as part of my medical decision-making.   MDM   Final diagnoses:  Muscle soreness  Chest wall pain  Cervical strain, initial encounter    Patient with muscle strains from mechanical fall yesterday. Symptoms have worsened. Patient was advised that symptoms may worsen after discharge yesterday, but she became concerned because the amount of extra pain that she is having. Today, her pain does seem to be worse, and her paraspinal and sternocleidomastoid muscles are much more tender to palpation. She does not have any bony abnormality or deformity, but is concerned  about her neck. Given patient's concern, will order imaging. I will also give her a soft neck collar for support. Additionally, patient states that she has had some anterior chest pain today. I suspect this is most likely from the fall as well, and is likely intercostal straining and is all musculoskeletal. Will check chest x-ray, cervical CT, labs, and EKG. Will give a dose of Valium.  Chest x-ray is negative for fracture, lungs are clear bilaterally. CT scan is negative for fracture. Patient feels better with soft collar on. Patient did have a  small wheeze in her left lung, this resolved after breathing treatment. The anterior chest wall tenderness remains, I've encouraged her to continue with the tramadol. She is allergic to everything else, and states that she is use to not being able to take anything but tramadol. I have advised her to use ice and heat as she is able. Labs are reassuring, troponin is negative, doubt ACS, or PE. Suspect that her symptoms are related to the fall. Will discharge to home with primary care follow-up.  I personally performed the services described in this documentation, which was scribed in my presence. The recorded information has been reviewed and is accurate.       Roxy Horseman, PA-C 08/07/15 1647  Cathren Laine, MD 08/14/15 [id number]

## 2015-08-07 NOTE — Discharge Instructions (Signed)
Cervical Strain and Sprain With Rehab °Cervical strain and sprain are injuries that commonly occur with "whiplash" injuries. Whiplash occurs when the neck is forcefully whipped backward or forward, such as during a motor vehicle accident or during contact sports. The muscles, ligaments, tendons, discs, and nerves of the neck are susceptible to injury when this occurs. °RISK FACTORS °Risk of having a whiplash injury increases if: °· Osteoarthritis of the spine. °· Situations that make head or neck accidents or trauma more likely. °· High-risk sports (football, rugby, wrestling, hockey, auto racing, gymnastics, diving, contact karate, or boxing). °· Poor strength and flexibility of the neck. °· Previous neck injury. °· Poor tackling technique. °· Improperly fitted or padded equipment. °SYMPTOMS  °· Pain or stiffness in the front or back of neck or both. °· Symptoms may present immediately or up to 24 hours after injury. °· Dizziness, headache, nausea, and vomiting. °· Muscle spasm with soreness and stiffness in the neck. °· Tenderness and swelling at the injury site. °PREVENTION °· Learn and use proper technique (avoid tackling with the head, spearing, and head-butting; use proper falling techniques to avoid landing on the head). °· Warm up and stretch properly before activity. °· Maintain physical fitness: °· Strength, flexibility, and endurance. °· Cardiovascular fitness. °· Wear properly fitted and padded protective equipment, such as padded soft collars, for participation in contact sports. °PROGNOSIS  °Recovery from cervical strain and sprain injuries is dependent on the extent of the injury. These injuries are usually curable in 1 week to 3 months with appropriate treatment.  °RELATED COMPLICATIONS  °· Temporary numbness and weakness may occur if the nerve roots are damaged, and this may persist until the nerve has completely healed. °· Chronic pain due to frequent recurrence of symptoms. °· Prolonged healing,  especially if activity is resumed too soon (before complete recovery). °TREATMENT  °Treatment initially involves the use of ice and medication to help reduce pain and inflammation. It is also important to perform strengthening and stretching exercises and modify activities that worsen symptoms so the injury does not get worse. These exercises may be performed at home or with a therapist. For patients who experience severe symptoms, a soft, padded collar may be recommended to be worn around the neck.  °Improving your posture may help reduce symptoms. Posture improvement includes pulling your chin and abdomen in while sitting or standing. If you are sitting, sit in a firm chair with your buttocks against the back of the chair. While sleeping, try replacing your pillow with a small towel rolled to 2 inches in diameter, or use a cervical pillow or soft cervical collar. Poor sleeping positions delay healing.  °For patients with nerve root damage, which causes numbness or weakness, the use of a cervical traction apparatus may be recommended. Surgery is rarely necessary for these injuries. However, cervical strain and sprains that are present at birth (congenital) may require surgery. °MEDICATION  °· If pain medication is necessary, nonsteroidal anti-inflammatory medications, such as aspirin and ibuprofen, or other minor pain relievers, such as acetaminophen, are often recommended. °· Do not take pain medication for 7 days before surgery. °· Prescription pain relievers may be given if deemed necessary by your caregiver. Use only as directed and only as much as you need. °HEAT AND COLD:  °· Cold treatment (icing) relieves pain and reduces inflammation. Cold treatment should be applied for 10 to 15 minutes every 2 to 3 hours for inflammation and pain and immediately after any activity that aggravates your   symptoms. Use ice packs or an ice massage. °· Heat treatment may be used prior to performing the stretching and  strengthening activities prescribed by your caregiver, physical therapist, or athletic trainer. Use a heat pack or a warm soak. °SEEK MEDICAL CARE IF:  °· Symptoms get worse or do not improve in 2 weeks despite treatment. °· New, unexplained symptoms develop (drugs used in treatment may produce side effects). °EXERCISES °RANGE OF MOTION (ROM) AND STRETCHING EXERCISES - Cervical Strain and Sprain °These exercises may help you when beginning to rehabilitate your injury. In order to successfully resolve your symptoms, you must improve your posture. These exercises are designed to help reduce the forward-head and rounded-shoulder posture which contributes to this condition. Your symptoms may resolve with or without further involvement from your physician, physical therapist or athletic trainer. While completing these exercises, remember:  °· Restoring tissue flexibility helps normal motion to return to the joints. This allows healthier, less painful movement and activity. °· An effective stretch should be held for at least 20 seconds, although you may need to begin with shorter hold times for comfort. °· A stretch should never be painful. You should only feel a gentle lengthening or release in the stretched tissue. °STRETCH- Axial Extensors °· Lie on your back on the floor. You may bend your knees for comfort. Place a rolled-up hand towel or dish towel, about 2 inches in diameter, under the part of your head that makes contact with the floor. °· Gently tuck your chin, as if trying to make a "double chin," until you feel a gentle stretch at the base of your head. °· Hold __________ seconds. °Repeat __________ times. Complete this exercise __________ times per day.  °STRETCH - Axial Extension  °· Stand or sit on a firm surface. Assume a good posture: chest up, shoulders drawn back, abdominal muscles slightly tense, knees unlocked (if standing) and feet hip width apart. °· Slowly retract your chin so your head slides back  and your chin slightly lowers. Continue to look straight ahead. °· You should feel a gentle stretch in the back of your head. Be certain not to feel an aggressive stretch since this can cause headaches later. °· Hold for __________ seconds. °Repeat __________ times. Complete this exercise __________ times per day. °STRETCH - Cervical Side Bend  °· Stand or sit on a firm surface. Assume a good posture: chest up, shoulders drawn back, abdominal muscles slightly tense, knees unlocked (if standing) and feet hip width apart. °· Without letting your nose or shoulders move, slowly tip your right / left ear to your shoulder until your feel a gentle stretch in the muscles on the opposite side of your neck. °· Hold __________ seconds. °Repeat __________ times. Complete this exercise __________ times per day. °STRETCH - Cervical Rotators  °· Stand or sit on a firm surface. Assume a good posture: chest up, shoulders drawn back, abdominal muscles slightly tense, knees unlocked (if standing) and feet hip width apart. °· Keeping your eyes level with the ground, slowly turn your head until you feel a gentle stretch along the back and opposite side of your neck. °· Hold __________ seconds. °Repeat __________ times. Complete this exercise __________ times per day. °RANGE OF MOTION - Neck Circles  °· Stand or sit on a firm surface. Assume a good posture: chest up, shoulders drawn back, abdominal muscles slightly tense, knees unlocked (if standing) and feet hip width apart. °· Gently roll your head down and around from the back   of one shoulder to the back of the other. The motion should never be forced or painful. °· Repeat the motion 10-20 times, or until you feel the neck muscles relax and loosen. °Repeat __________ times. Complete the exercise __________ times per day. °STRENGTHENING EXERCISES - Cervical Strain and Sprain °These exercises may help you when beginning to rehabilitate your injury. They may resolve your symptoms with or  without further involvement from your physician, physical therapist, or athletic trainer. While completing these exercises, remember:  °· Muscles can gain both the endurance and the strength needed for everyday activities through controlled exercises. °· Complete these exercises as instructed by your physician, physical therapist, or athletic trainer. Progress the resistance and repetitions only as guided. °· You may experience muscle soreness or fatigue, but the pain or discomfort you are trying to eliminate should never worsen during these exercises. If this pain does worsen, stop and make certain you are following the directions exactly. If the pain is still present after adjustments, discontinue the exercise until you can discuss the trouble with your clinician. °STRENGTH - Cervical Flexors, Isometric °· Face a wall, standing about 6 inches away. Place a small pillow, a ball about 6-8 inches in diameter, or a folded towel between your forehead and the wall. °· Slightly tuck your chin and gently push your forehead into the soft object. Push only with mild to moderate intensity, building up tension gradually. Keep your jaw and forehead relaxed. °· Hold 10 to 20 seconds. Keep your breathing relaxed. °· Release the tension slowly. Relax your neck muscles completely before you start the next repetition. °Repeat __________ times. Complete this exercise __________ times per day. °STRENGTH- Cervical Lateral Flexors, Isometric  °· Stand about 6 inches away from a wall. Place a small pillow, a ball about 6-8 inches in diameter, or a folded towel between the side of your head and the wall. °· Slightly tuck your chin and gently tilt your head into the soft object. Push only with mild to moderate intensity, building up tension gradually. Keep your jaw and forehead relaxed. °· Hold 10 to 20 seconds. Keep your breathing relaxed. °· Release the tension slowly. Relax your neck muscles completely before you start the next  repetition. °Repeat __________ times. Complete this exercise __________ times per day. °STRENGTH - Cervical Extensors, Isometric  °· Stand about 6 inches away from a wall. Place a small pillow, a ball about 6-8 inches in diameter, or a folded towel between the back of your head and the wall. °· Slightly tuck your chin and gently tilt your head back into the soft object. Push only with mild to moderate intensity, building up tension gradually. Keep your jaw and forehead relaxed. °· Hold 10 to 20 seconds. Keep your breathing relaxed. °· Release the tension slowly. Relax your neck muscles completely before you start the next repetition. °Repeat __________ times. Complete this exercise __________ times per day. °POSTURE AND BODY MECHANICS CONSIDERATIONS - Cervical Strain and Sprain °Keeping correct posture when sitting, standing or completing your activities will reduce the stress put on different body tissues, allowing injured tissues a chance to heal and limiting painful experiences. The following are general guidelines for improved posture. Your physician or physical therapist will provide you with any instructions specific to your needs. While reading these guidelines, remember: °· The exercises prescribed by your provider will help you have the flexibility and strength to maintain correct postures. °· The correct posture provides the optimal environment for your joints to work.   All of your joints have less wear and tear when properly supported by a spine with good posture. This means you will experience a healthier, less painful body. °· Correct posture must be practiced with all of your activities, especially prolonged sitting and standing. Correct posture is as important when doing repetitive low-stress activities (typing) as it is when doing a single heavy-load activity (lifting). °PROLONGED STANDING WHILE SLIGHTLY LEANING FORWARD °When completing a task that requires you to lean forward while standing in one  place for a long time, place either foot up on a stationary 2- to 4-inch high object to help maintain the best posture. When both feet are on the ground, the low back tends to lose its slight inward curve. If this curve flattens (or becomes too large), then the back and your other joints will experience too much stress, fatigue more quickly, and can cause pain.  °RESTING POSITIONS °Consider which positions are most painful for you when choosing a resting position. If you have pain with flexion-based activities (sitting, bending, stooping, squatting), choose a position that allows you to rest in a less flexed posture. You would want to avoid curling into a fetal position on your side. If your pain worsens with extension-based activities (prolonged standing, working overhead), avoid resting in an extended position such as sleeping on your stomach. Most people will find more comfort when they rest with their spine in a more neutral position, neither too rounded nor too arched. Lying on a non-sagging bed on your side with a pillow between your knees, or on your back with a pillow under your knees will often provide some relief. Keep in mind, being in any one position for a prolonged period of time, no matter how correct your posture, can still lead to stiffness. °WALKING °Walk with an upright posture. Your ears, shoulders, and hips should all line up. °OFFICE WORK °When working at a desk, create an environment that supports good, upright posture. Without extra support, muscles fatigue and lead to excessive strain on joints and other tissues. °CHAIR: °· A chair should be able to slide under your desk when your back makes contact with the back of the chair. This allows you to work closely. °· The chair's height should allow your eyes to be level with the upper part of your monitor and your hands to be slightly lower than your elbows. °· Body position: °¨ Your feet should make contact with the floor. If this is not  possible, use a foot rest. °¨ Keep your ears over your shoulders. This will reduce stress on your neck and low back. °  °This information is not intended to replace advice given to you by your health care provider. Make sure you discuss any questions you have with your health care provider. °  °Document Released: 07/14/2005 Document Revised: 08/04/2014 Document Reviewed: 10/26/2008 °Elsevier Interactive Patient Education ©2016 [employer] ° °Chest Wall Pain °Chest wall pain is pain in or around the bones and muscles of your chest. Sometimes, an injury causes this pain. Sometimes, the cause may not be known. This pain may take several weeks or longer to get better. °HOME CARE INSTRUCTIONS  °Pay attention to any changes in your symptoms. Take these actions to help with your pain:  °· Rest as told by your health care provider.   °· Avoid activities that cause pain. These include any activities that use your chest muscles or your abdominal and side muscles to lift heavy items.    °· If directed, apply   ice to the painful area: °¨ Put ice in a plastic bag. °¨ Place a towel between your skin and the bag. °¨ Leave the ice on for 20 minutes, 2-3 times per day. °· Take over-the-counter and prescription medicines only as told by your health care provider. °· Do not use tobacco products, including cigarettes, chewing tobacco, and e-cigarettes. If you need help quitting, ask your health care provider. °· Keep all follow-up visits as told by your health care provider. This is important. °SEEK MEDICAL CARE IF: °· You have a fever. °· Your chest pain becomes worse. °· You have new symptoms. °SEEK IMMEDIATE MEDICAL CARE IF: °· You have nausea or vomiting. °· You feel sweaty or light-headed. °· You have a cough with phlegm (sputum) or you cough up blood. °· You develop shortness of breath. °  °This information is not intended to replace advice given to you by your health care provider. Make sure you discuss any questions you have  with your health care provider. °  °Document Released: 07/14/2005 Document Revised: 04/04/2015 Document Reviewed: 10/09/2014 °Elsevier Interactive Patient Education ©2016 [employer] ° °

## 2015-08-17 ENCOUNTER — Other Ambulatory Visit: Payer: Self-pay | Admitting: Obstetrics and Gynecology

## 2015-08-20 LAB — CYTOLOGY - PAP

## 2016-05-03 ENCOUNTER — Other Ambulatory Visit: Payer: Self-pay | Admitting: Radiology

## 2016-05-03 DIAGNOSIS — Z79899 Other long term (current) drug therapy: Secondary | ICD-10-CM

## 2016-06-27 ENCOUNTER — Other Ambulatory Visit: Payer: Self-pay | Admitting: Radiology

## 2016-06-27 ENCOUNTER — Other Ambulatory Visit: Payer: Self-pay | Admitting: Rheumatology

## 2016-06-27 DIAGNOSIS — Z79899 Other long term (current) drug therapy: Secondary | ICD-10-CM

## 2016-06-27 LAB — CBC WITH DIFFERENTIAL/PLATELET
BASOS PCT: 0 %
Basophils Absolute: 0 cells/uL (ref 0–200)
Eosinophils Absolute: 138 cells/uL (ref 15–500)
Eosinophils Relative: 3 %
HCT: 42.2 % (ref 35.0–45.0)
Hemoglobin: 14.2 g/dL (ref 11.7–15.5)
Lymphocytes Relative: 37 %
Lymphs Abs: 1702 cells/uL (ref 850–3900)
MCH: 32.3 pg (ref 27.0–33.0)
MCHC: 33.6 g/dL (ref 32.0–36.0)
MCV: 95.9 fL (ref 80.0–100.0)
MONO ABS: 506 {cells}/uL (ref 200–950)
MONOS PCT: 11 %
MPV: 9.6 fL (ref 7.5–12.5)
Neutro Abs: 2254 cells/uL (ref 1500–7800)
Neutrophils Relative %: 49 %
PLATELETS: 245 10*3/uL (ref 140–400)
RBC: 4.4 MIL/uL (ref 3.80–5.10)
RDW: 13.5 % (ref 11.0–15.0)
WBC: 4.6 10*3/uL (ref 3.8–10.8)

## 2016-06-28 LAB — COMPLETE METABOLIC PANEL WITH GFR
ALT: 14 U/L (ref 6–29)
AST: 17 U/L (ref 10–35)
Albumin: 3.9 g/dL (ref 3.6–5.1)
Alkaline Phosphatase: 51 U/L (ref 33–115)
BUN: 14 mg/dL (ref 7–25)
CHLORIDE: 102 mmol/L (ref 98–110)
CO2: 26 mmol/L (ref 20–31)
CREATININE: 0.83 mg/dL (ref 0.50–1.10)
Calcium: 9.6 mg/dL (ref 8.6–10.2)
GFR, Est African American: >89 mL/min (ref >=60)
GFR, Est Non African American: 84 mL/min (ref >=60)
GLUCOSE: 82 mg/dL (ref 65–99)
POTASSIUM: 4.3 mmol/L (ref 3.5–5.3)
SODIUM: 137 mmol/L (ref 135–146)
Total Bilirubin: 0.5 mg/dL (ref 0.2–1.2)
Total Protein: 7.4 g/dL (ref 6.1–8.1)

## 2016-06-30 NOTE — Progress Notes (Signed)
Labs normal.

## 2016-07-01 DIAGNOSIS — M19049 Primary osteoarthritis, unspecified hand: Secondary | ICD-10-CM | POA: Insufficient documentation

## 2016-07-01 DIAGNOSIS — M5136 Other intervertebral disc degeneration, lumbar region: Secondary | ICD-10-CM | POA: Insufficient documentation

## 2016-07-01 DIAGNOSIS — M0609 Rheumatoid arthritis without rheumatoid factor, multiple sites: Secondary | ICD-10-CM | POA: Insufficient documentation

## 2016-07-01 DIAGNOSIS — Z79899 Other long term (current) drug therapy: Secondary | ICD-10-CM | POA: Insufficient documentation

## 2016-07-01 DIAGNOSIS — M179 Osteoarthritis of knee, unspecified: Secondary | ICD-10-CM | POA: Insufficient documentation

## 2016-07-01 DIAGNOSIS — M1710 Unilateral primary osteoarthritis, unspecified knee: Secondary | ICD-10-CM | POA: Insufficient documentation

## 2016-07-01 NOTE — Progress Notes (Signed)
Office Visit Note  Patient: Laura Medina             Date of Birth: [birth year]           MRN: [id number]             PCP: Paulino RilyJONES,ENRICO G, MD Referring: No ref. provider found Visit Date: 07/03/2016 Occupation: @GUAROCC @    Subjective:  Pain of the Right Hand and Pain of the Left Hand   History of Present Illness: Laura BaltimoreKimberly Denz is a 50 y.o. female  Last seen 04/03/2016  Doing well with her seronegative rheumatoid arthritis.Joint pain swelling and stiffness except for hand pain consistent with osteoarthritis.  Note that patient was on sulfasalazine and methotrexate and Plaquenil in the past but we had to discontinue because she was intolerant to it.  Patient states that she is hurting all over consistent with fibromyalgia. She is also complaining of fatigue and insomnia.   Activities of Daily Living:  Patient reports morning stiffness for 30 minutes.   Patient Denies nocturnal pain.  Difficulty dressing/grooming: Denies Difficulty climbing stairs: Denies Difficulty getting out of chair: Denies Difficulty using hands for taps, buttons, cutlery, and/or writing: Denies   No Rheumatology ROS completed.   PMFS History:  Patient Active Problem List   Diagnosis Date Noted  . 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 High risk medication use 07/01/2016  . Osteoarthritis, 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  . Osteoarthritis, knee 07/01/2016  . DDD (degenerative disc disease), lumbar 07/01/2016  . Fibromyalgia 02/16/2013  . 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 Disturbance of skin sensation 02/16/2013  . Pain in limb 02/16/2013    Past Medical History:  Diagnosis Date  . Arthritis   . Fibromyalgia     Family History  Problem Relation Age of Onset  . Lung cancer Paternal Grandmother    Past Surgical History:  Procedure Laterality Date  . blood clot evacuation     left leg  . CHOLECYSTECTOMY    . EXPLORATORY LAPAROTOMY    .  HEMORRHOID SURGERY     Social History   Social History Narrative  . No narrative on file     Objective: Vital Signs: BP 128/80   Pulse 64   Ht 5' 4.5" (1.638 m)   Wt 246 lb (111.6 kg)   LMP 06/22/2016 (Exact Date)   BMI 41.57 kg/m    Physical Exam   Musculoskeletal Exam:  Full range of motion of all joints Grip strength is equal and strong bilaterally Fibromyalgia tender points are all present: 18 out of 18  CDAI Exam: No CDAI exam completed.  No synovitis on examination  Investigation:  Labs from 04/03/2016 shows CMP with GFR normal Creatinine at 0.85 GFR at greater than 89  CBC with differential is normal Sedimentation rate normal at 8 Rheumatoid factor negative at less than 10 CCP is negative at less than 16 14-3-3-ETA protein is negative TB gold is negative  Vitamin D is low at 22 (were treating her currently with 50,000 IUs every week for 3 months And then patient will go to 5000 units once a week. If repeat vitamin D is still low we will adjust her daily dose of over-the-counter vitamin D.  Findings:  04/14/2016 CBC and CMP normal  08/16/2015 Vit D and G6PD normal   06/24/2013 screening HIV, Hepatitis panel and TB gold negative   04/15/2016 After informed consent was obtained, ultrasound examination of bilateral hands was performed using a 12 MHz transducer,  Gray scale, and power Doppler.  Bilateral 2nd, 3rd, and 5th MCP joints, both dorsal and volar aspects, and bilateral wrist joints, both volar and dorsal aspects, were evaluated to look for any underlying synovitis or tenosynovitis.  The findings were that she had no synovitis or tenosynovitis in her joints.  Her right median nerve was 0.08 cm square, and left median nerve was also 0.08 cm square which was bifid.  They are within normal limits.   IMPRESSION:  There was no evidence of inflammatory arthritis in her MCP joints or wrist joints, and the median nerves were within normal limits.  08/16/2015    X-rays of bilateral hands, 2 views today, showed bilateral PIP narrowing and right 2nd MCP mild narrowing.  Bilateral feet x-rays showed bilateral 1st MTP narrowing, PIP and DIP narrowing, and small bilateral calcaneal spurs.   Recent labs done September 2017 for autoimmune workup show everything is negative except for low vitamin D. We've been giving her vitamin D supplementation She is taking it as prescribed She'll be finished with vitamin D shortly and she'll take over-the-counter vitamin D. I discussed this in full detail with the patient she understands and is agreeable.   Imaging: No results found.  Speciality Comments: No specialty comments available.    Procedures:  No procedures performed Allergies: Gabapentin; Lyrica [pregabalin]; Quinine derivatives; Topamax [topiramate]; Hydrocodone; Oxycodone; and Penicillins   Assessment / Plan:     Visit Diagnoses: Fibromyalgia  Rheumatoid arthritis of multiple sites without rheumatoid factor (HCC) - since intolerant to PLQ, MTX, SSZ and no synovitis on Sero Negatvie RA, not on any dmard at this time.  High risk medication use - since intolerant to PLQ, MTX, SSZ and no synovitis on Sero Negatvie RA, not on any dmard at this time.  Functional gait disorder  Primary osteoarthritis of both hands  Primary osteoarthritis of both knees  DDD (degenerative disc disease), lumbar   We spent a great deal of time discussing her seronegative rheumatoid arthritis and to look out for signs and symptoms of synovitis and flares. She is to contact us if there is a problem  Patient's fibromyalgia needs to be better controlled with proper exercise and sleep. Patient is agreeable and will consider doing water aerobics.  We will offer patient ARAVA in the future if there is synovitis on examination if appropriate.  Return to clinic in 4 months. ( We are trying to monitor the patient closely because of the seronegative rheumatoid  arthritis.).  Orders: No orders of the defined types were placed in this encounter.  No orders of the defined types were placed in this encounter.   Face-to-face time spent with patient was 40 minutes. 50% of time was spent in counseling and coordination of care.  Follow-Up Instructions: Return in about 4 months (around 11/01/2016) for SERO Neg RA, FMA, Fatigue, Insomnia, exercise.   Tawni PummelNaitik Amillion [place], PA-C   I examined and evaluated the patient with Tawni PummelNaitik Lawerence Dery PA. The plan of care was discussed as noted above.  Pollyann SavoyShaili Deveshwar, MD

## 2016-07-03 ENCOUNTER — Encounter: Payer: Self-pay | Admitting: Rheumatology

## 2016-07-03 ENCOUNTER — Ambulatory Visit (INDEPENDENT_AMBULATORY_CARE_PROVIDER_SITE_OTHER): Payer: Medicare HMO | Admitting: Rheumatology

## 2016-07-03 VITALS — BP 128/80 | HR 64 | Ht 64.5 in | Wt 246.0 lb

## 2016-07-03 DIAGNOSIS — R269 Unspecified abnormalities of gait and mobility: Secondary | ICD-10-CM

## 2016-07-03 DIAGNOSIS — Z79899 Other long term (current) drug therapy: Secondary | ICD-10-CM

## 2016-07-03 DIAGNOSIS — M19041 Primary osteoarthritis, right hand: Secondary | ICD-10-CM

## 2016-07-03 DIAGNOSIS — M19042 Primary osteoarthritis, left hand: Secondary | ICD-10-CM

## 2016-07-03 DIAGNOSIS — M170 Bilateral primary osteoarthritis of knee: Secondary | ICD-10-CM

## 2016-07-03 DIAGNOSIS — M0609 Rheumatoid arthritis without rheumatoid factor, multiple sites: Secondary | ICD-10-CM | POA: Diagnosis not present

## 2016-07-03 DIAGNOSIS — M797 Fibromyalgia: Secondary | ICD-10-CM | POA: Diagnosis not present

## 2016-07-03 DIAGNOSIS — M5136 Other intervertebral disc degeneration, lumbar region: Secondary | ICD-10-CM

## 2016-07-04 ENCOUNTER — Other Ambulatory Visit: Payer: Self-pay | Admitting: Rheumatology

## 2016-07-04 LAB — VITAMIN D 25 HYDROXY (VIT D DEFICIENCY, FRACTURES): VIT D 25 HYDROXY: 45 ng/mL (ref 30–100)

## 2016-07-05 NOTE — Progress Notes (Signed)
Vitamin D normal. May continue over-the-counter supplement

## 2016-08-10 ENCOUNTER — Other Ambulatory Visit: Payer: Self-pay | Admitting: Rheumatology

## 2016-08-11 NOTE — Telephone Encounter (Signed)
Last Visit: 07/03/16 Next Visit: 11/03/16 Labs: 06/27/16 WNL  Okay to refill Flexeril?

## 2016-09-01 ENCOUNTER — Other Ambulatory Visit: Payer: Self-pay | Admitting: *Deleted

## 2016-09-01 DIAGNOSIS — Z79899 Other long term (current) drug therapy: Secondary | ICD-10-CM

## 2016-09-01 LAB — CBC WITH DIFFERENTIAL/PLATELET
BASOS PCT: 0 %
Basophils Absolute: 0 cells/uL (ref 0–200)
EOS ABS: 150 {cells}/uL (ref 15–500)
EOS PCT: 3 %
HCT: 41.3 % (ref 35.0–45.0)
HEMOGLOBIN: 13.9 g/dL (ref 11.7–15.5)
Lymphocytes Relative: 36 %
Lymphs Abs: 1800 cells/uL (ref 850–3900)
MCH: 32.3 pg (ref 27.0–33.0)
MCHC: 33.7 g/dL (ref 32.0–36.0)
MCV: 96 fL (ref 80.0–100.0)
MPV: 9.1 fL (ref 7.5–12.5)
Monocytes Absolute: 500 cells/uL (ref 200–950)
Monocytes Relative: 10 %
NEUTROS ABS: 2550 {cells}/uL (ref 1500–7800)
Neutrophils Relative %: 51 %
Platelets: 233 10*3/uL (ref 140–400)
RBC: 4.3 MIL/uL (ref 3.80–5.10)
RDW: 13.5 % (ref 11.0–15.0)
WBC: 5 10*3/uL (ref 3.8–10.8)

## 2016-09-01 LAB — COMPLETE METABOLIC PANEL WITH GFR
ALBUMIN: 4 g/dL (ref 3.6–5.1)
ALK PHOS: 44 U/L (ref 33–115)
ALT: 15 U/L (ref 6–29)
AST: 15 U/L (ref 10–35)
BILIRUBIN TOTAL: 0.6 mg/dL (ref 0.2–1.2)
BUN: 12 mg/dL (ref 7–25)
CO2: 29 mmol/L (ref 20–31)
Calcium: 9.4 mg/dL (ref 8.6–10.2)
Chloride: 104 mmol/L (ref 98–110)
Creat: 0.9 mg/dL (ref 0.50–1.10)
GFR, Est African American: 87 mL/min (ref >=60)
GFR, Est Non African American: 76 mL/min (ref >=60)
Glucose, Bld: 84 mg/dL (ref 65–99)
Potassium: 3.9 mmol/L (ref 3.5–5.3)
SODIUM: 139 mmol/L (ref 135–146)
TOTAL PROTEIN: 7.4 g/dL (ref 6.1–8.1)

## 2016-09-25 ENCOUNTER — Other Ambulatory Visit: Payer: Self-pay | Admitting: Obstetrics and Gynecology

## 2016-09-26 LAB — CYTOLOGY - PAP

## 2016-11-03 ENCOUNTER — Ambulatory Visit: Payer: Medicare HMO | Admitting: Rheumatology

## 2016-11-03 NOTE — Progress Notes (Signed)
Office Visit Note  Patient: Laura Medina             Date of Birth: [birth year]           MRN: [id number]             PCP: Andria Frames, MD Referring: Kristie Cowman, MD Visit Date: 11/04/2016 Occupation: @GUAROCC @    Subjective:  Follow-up   History of Present Illness: Laura Medina is a 51 y.o. female   Last seen 07/03/2016. That notices in Epic. Patient has a history of seronegative rheumatoid arthritis. She has no joint pain stiffness and swelling. We have tried Plaquenil/sulfasalazine/methotrexate but she could not tolerate any of these medications. She does not have any active rheumatoid arthritis at this time so she is not on any immunosuppressant.  She also has a history of fibromyalgia. The FMS is causing a lot of problems to the patient. She is tolerating the her discomfort as best she can. She does exercise about 90 minutes every day Monday through Friday. She states that despite exercising for this long period of time, and despite being exhausted, she does not feel that she gets a good night sleep. She states that she tosses and turns all third night.  Activities of Daily Living:  Patient reports morning stiffness for 60 minutes.   Patient Reports nocturnal pain.  Difficulty dressing/grooming: Denies Difficulty climbing stairs: Reports Difficulty getting out of chair: Denies Difficulty using hands for taps, buttons, cutlery, and/or writing: Reports   Review of Systems  Constitutional: Positive for fatigue.  HENT: Negative for mouth sores and mouth dryness.   Eyes: Negative for dryness.  Respiratory: Negative for shortness of breath.   Gastrointestinal: Negative for constipation and diarrhea.  Musculoskeletal: Positive for myalgias and myalgias.  Skin: Negative for sensitivity to sunlight.  Psychiatric/Behavioral: Positive for sleep disturbance. Negative for decreased concentration.    PMFS History:  Patient Active Problem List   Diagnosis Date  Noted  . Rheumatoid arthritis of multiple sites without rheumatoid factor ([address]) 07/01/2016  . High risk medication use 07/01/2016  . Osteoarthritis, 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  . Osteoarthritis, knee 07/01/2016  . DDD (degenerative disc disease), lumbar 07/01/2016  . Fibromyalgia 02/16/2013  . 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 Disturbance of skin sensation 02/16/2013  . Pain in limb 02/16/2013    Past Medical History:  Diagnosis Date  . Arthritis   . Fibromyalgia     Family History  Problem Relation Age of Onset  . Lung cancer Paternal Grandmother    Past Surgical History:  Procedure Laterality Date  . blood clot evacuation     left leg  . CHOLECYSTECTOMY    . EXPLORATORY LAPAROTOMY    . HEMORRHOID SURGERY     Social History   Social History Narrative  . No narrative on file     Objective: Vital Signs: BP 124/74   Pulse 82   Resp 14   Ht 5' 4.5" (1.638 m)   Wt 249 lb (112.9 kg)   LMP 10/27/2016 (Exact Date)   BMI 42.08 kg/m    Physical Exam  Constitutional: She is oriented to person, place, and time. She appears well-developed and well-nourished.  HENT:  Head: Normocephalic and atraumatic.  Eyes: EOM are normal. Pupils are equal, round, and reactive to light.  Cardiovascular: Normal rate, regular rhythm and normal heart sounds.  Exam reveals no gallop and no friction rub.   No murmur heard. Pulmonary/Chest: Effort normal and breath sounds normal. She has no wheezes. She  has no rales.  Abdominal: Soft. Bowel sounds are normal. She exhibits no distension. There is no tenderness. There is no guarding. No hernia.  Musculoskeletal: Normal range of motion. She exhibits no edema, tenderness or deformity.  Lymphadenopathy:    She has no cervical adenopathy.  Neurological: She is alert and oriented to person, place, and time. Coordination normal.  Skin: Skin is warm and dry. Capillary refill takes less than 2 seconds. No rash noted.  Psychiatric: She has a normal mood and  affect. Her behavior is normal.  Nursing note and vitals reviewed.    Musculoskeletal Exam:  Full range of motion of all joints Grip strength is equal and strong bilaterally Fibromyalgia tender points are 18 out of 18 positive  CDAI Exam: CDAI Homunculus Exam:   Joint Counts:  CDAI Tender Joint count: 0 CDAI Swollen Joint count: 0  Global Assessments:  Patient Global Assessment: 9 Provider Global Assessment: 9  CDAI Calculated Score: 18  No synovitis on exam Patient's fibromyalgia discomfort can be as low as a 3 and high as a 9 on a scale of 0-10   Investigation: Findings:  Labs from 04/03/2016 shows CMP with GFR normal Creatinine at 0.85 GFR at greater than 89  CBC with differential is normal Sedimentation rate normal at 8 Rheumatoid factor negative at less than 10 CCP is negative at less than 16 14-3-3-ETA protein is negative TB gold is negative  Vitamin D is low at 22 (were treating her currently with 50,000 IUs every week for 3 months And then patient will go to 5000 units once a week. If repeat vitamin D is still low we will adjust her daily dose of over-the-counter vitamin D.  04/14/2016 CBC and CMP normal  08/16/2015 Vit D and G6PD normal   06/24/2013 screening HIV, Hepatitis panel and TB gold negative   04/15/2016 After informed consent was obtained, ultrasound examination of bilateral hands was performed using a 12 MHz transducer, Gray scale, and power Doppler.  Bilateral 2nd, 3rd, and 5th MCP joints, both dorsal and volar aspects, and bilateral wrist joints, both volar and dorsal aspects, were evaluated to look for any underlying synovitis or tenosynovitis.  The findings were that she had no synovitis or tenosynovitis in her joints.  Her right median nerve was 0.08 cm square, and left median nerve was also 0.08 cm square which was bifid.  They are within normal limits.  08/16/2015   X-rays of bilateral hands, 2 views today, showed bilateral PIP narrowing  and right 2nd MCP mild narrowing.  Bilateral feet x-rays showed bilateral 1st MTP narrowing, PIP and DIP narrowing, and small bilateral calcaneal spurs.  Recent labs done September 2017 for autoimmune workup show everything is negative except for low vitamin D. We've been giving her vitamin D supplementation She is taking it as prescribed She'll be finished with vitamin D shortly and she'll take over-the-counter vitamin D. I discussed this in full detail with the patient she understands and is agreeable    Orders Only on 09/25/2016  Component Date Value Ref Range Status  . CYTOLOGY - PAP 09/25/2016 PAP RESULT   Final  Orders Only on 09/01/2016  Component Date Value Ref Range Status  . WBC 09/01/2016 5.0  3.8 - 10.8 K/uL Final  . RBC 09/01/2016 4.30  3.80 - 5.10 MIL/uL Final  . Hemoglobin 09/01/2016 13.9  11.7 - 15.5 g/dL Final  . HCT 09/01/2016 41.3  35.0 - 45.0 % Final  . MCV 09/01/2016 96.0  80.0 -  100.0 fL Final  . MCH 09/01/2016 32.3  27.0 - 33.0 pg Final  . MCHC 09/01/2016 33.7  32.0 - 36.0 g/dL Final  . RDW 09/01/2016 13.5  11.0 - 15.0 % Final  . Platelets 09/01/2016 233  140 - 400 K/uL Final  . MPV 09/01/2016 9.1  7.5 - 12.5 fL Final  . Neutro Abs 09/01/2016 2550  1,500 - 7,800 cells/uL Final  . Lymphs Abs 09/01/2016 1800  850 - 3,900 cells/uL Final  . Monocytes Absolute 09/01/2016 500  200 - 950 cells/uL Final  . Eosinophils Absolute 09/01/2016 150  15 - 500 cells/uL Final  . Basophils Absolute 09/01/2016 0  0 - 200 cells/uL Final  . Neutrophils Relative % 09/01/2016 51  % Final  . Lymphocytes Relative 09/01/2016 36  % Final  . Monocytes Relative 09/01/2016 10  % Final  . Eosinophils Relative 09/01/2016 3  % Final  . Basophils Relative 09/01/2016 0  % Final  . Smear Review 09/01/2016 Criteria for review not met   Final  . Sodium 09/01/2016 139  135 - 146 mmol/L Final  . Potassium 09/01/2016 3.9  3.5 - 5.3 mmol/L Final  . Chloride 09/01/2016 104  98 - 110 mmol/L Final  .  CO2 09/01/2016 29  20 - 31 mmol/L Final  . Glucose, Bld 09/01/2016 84  65 - 99 mg/dL Final  . BUN 09/01/2016 12  7 - 25 mg/dL Final  . Creat 09/01/2016 0.90  0.50 - 1.10 mg/dL Final  . Total Bilirubin 09/01/2016 0.6  0.2 - 1.2 mg/dL Final  . Alkaline Phosphatase 09/01/2016 44  33 - 115 U/L Final  . AST 09/01/2016 15  10 - 35 U/L Final  . ALT 09/01/2016 15  6 - 29 U/L Final  . Total Protein 09/01/2016 7.4  6.1 - 8.1 g/dL Final  . Albumin 09/01/2016 4.0  3.6 - 5.1 g/dL Final  . Calcium 09/01/2016 9.4  8.6 - 10.2 mg/dL Final  . GFR, Est African American 09/01/2016 87  >=60 mL/min Final  . GFR, Est Non African American 09/01/2016 76  >=60 mL/min Final  Orders Only on 06/27/2016  Component Date Value Ref Range Status  . Vit D, 25-Hydroxy 06/27/2016 45  30 - 100 ng/mL Final   Comment: Vitamin D Status           25-OH Vitamin D        Deficiency                <20 ng/mL        Insufficiency         20 - 29 ng/mL        Optimal             > or = 30 ng/mL   For 25-OH Vitamin D testing on patients on D2-supplementation and patients for whom quantitation of D2 and D3 fractions is required, the QuestAssureD 25-OH VIT D, (D2,D3), LC/MS/MS is recommended: order code [id number] (patients > 2 yrs).   Orders Only on 06/27/2016  Component Date Value Ref Range Status  . WBC 06/27/2016 4.6  3.8 - 10.8 K/uL Final  . RBC 06/27/2016 4.40  3.80 - 5.10 MIL/uL Final  . Hemoglobin 06/27/2016 14.2  11.7 - 15.5 g/dL Final  . HCT 06/27/2016 42.2  35.0 - 45.0 % Final  . MCV 06/27/2016 95.9  80.0 - 100.0 fL Final  . MCH 06/27/2016 32.3  27.0 - 33.0 pg Final  . MCHC 06/27/2016 33.6  32.0 -  36.0 g/dL Final  . RDW 06/27/2016 13.5  11.0 - 15.0 % Final  . Platelets 06/27/2016 245  140 - 400 K/uL Final  . MPV 06/27/2016 9.6  7.5 - 12.5 fL Final  . Neutro Abs 06/27/2016 2254  1,500 - 7,800 cells/uL Final  . Lymphs Abs 06/27/2016 1702  850 - 3,900 cells/uL Final  . Monocytes Absolute 06/27/2016 506  200 - 950 cells/uL  Final  . Eosinophils Absolute 06/27/2016 138  15 - 500 cells/uL Final  . Basophils Absolute 06/27/2016 0  0 - 200 cells/uL Final  . Neutrophils Relative % 06/27/2016 49  % Final  . Lymphocytes Relative 06/27/2016 37  % Final  . Monocytes Relative 06/27/2016 11  % Final  . Eosinophils Relative 06/27/2016 3  % Final  . Basophils Relative 06/27/2016 0  % Final  . Smear Review 06/27/2016 Criteria for review not met   Final  . Sodium 06/27/2016 137  135 - 146 mmol/L Final  . Potassium 06/27/2016 4.3  3.5 - 5.3 mmol/L Final  . Chloride 06/27/2016 102  98 - 110 mmol/L Final  . CO2 06/27/2016 26  20 - 31 mmol/L Final  . Glucose, Bld 06/27/2016 82  65 - 99 mg/dL Final  . BUN 06/27/2016 14  7 - 25 mg/dL Final  . Creat 06/27/2016 0.83  0.50 - 1.10 mg/dL Final  . Total Bilirubin 06/27/2016 0.5  0.2 - 1.2 mg/dL Final  . Alkaline Phosphatase 06/27/2016 51  33 - 115 U/L Final  . AST 06/27/2016 17  10 - 35 U/L Final  . ALT 06/27/2016 14  6 - 29 U/L Final  . Total Protein 06/27/2016 7.4  6.1 - 8.1 g/dL Final  . Albumin 06/27/2016 3.9  3.6 - 5.1 g/dL Final  . Calcium 06/27/2016 9.6  8.6 - 10.2 mg/dL Final  . GFR, Est African American 06/27/2016 >89  >=60 mL/min Final  . GFR, Est Non African American 06/27/2016 84  >=60 mL/min Final     Imaging: No results found.  Speciality Comments: No specialty comments available.    Procedures:  No procedures performed Allergies: Gabapentin; Lyrica [pregabalin]; Morphine; Quinine derivatives; Topamax [topiramate]; Hydrocodone; Oxycodone; and Penicillins   Assessment / Plan:     Visit Diagnoses: Fibromyalgia  Rheumatoid arthritis of multiple sites without rheumatoid factor (HCC) - intolerant to PLQ, MTX, SSZ and no synovitis on Sero Negatvie RA  High risk medication use - not on any dmard at this time  Functional gait disorder  Primary osteoarthritis of both hands  Primary osteoarthritis of both knees  DDD (degenerative disc disease), lumbar    Plan: #1: Rheumatoid arthritis. Seronegative. Not on any suppressive at this time. We have tried Plaquenil/sulfasalazine/methotrexate in the past but patient could not tolerate them. She does not have active rheumatoid arthritis at this time and so she is not on any immunosuppressant at this time.  #2: High-risk prescription. Not on any immunosuppressant at this time.  #3: Fibromyalgia syndrome. The pain and discomfort is rated 9 on a scale of 0-10 but can be as low as 3 on some days. Patient is exercising 90 minutes every day. Despite this, she is not getting good night sleep. I've advised the patient to minimize her exercising. She will try 80 minutes Monday through Friday and call us back with an update on how she is doing. If she still struggles for sleep flaring with her fibromyalgia, we will do 60 minutes Monday through Friday. I would like to find the proper  quantities of exercise of the patient's fibromyalgia is better controlled. Typically, most patients do well w/ 150 minutes/ week  #4: Return to clinic in 5 months   Orders: No orders of the defined types were placed in this encounter.  No orders of the defined types were placed in this encounter.   Face-to-face time spent with patient was 30 minutes. 50% of time was spent in counseling and coordination of care.  Follow-Up Instructions: Return in about 5 months (around 04/06/2017) for RA(no flare) // , FMS, FATIGUE,INSOMNIA, rt foot pain.   Eliezer Lofts, PA-C  Note - This record has been created using [employer].  Chart creation errors have been sought, but may not always  have been located. Such creation errors do not reflect on  the standard of medical care.

## 2016-11-04 ENCOUNTER — Ambulatory Visit (INDEPENDENT_AMBULATORY_CARE_PROVIDER_SITE_OTHER): Payer: Medicare HMO | Admitting: Rheumatology

## 2016-11-04 ENCOUNTER — Encounter: Payer: Self-pay | Admitting: Rheumatology

## 2016-11-04 VITALS — BP 124/74 | HR 82 | Resp 14 | Ht 64.5 in | Wt 249.0 lb

## 2016-11-04 DIAGNOSIS — R269 Unspecified abnormalities of gait and mobility: Secondary | ICD-10-CM | POA: Diagnosis not present

## 2016-11-04 DIAGNOSIS — Z79899 Other long term (current) drug therapy: Secondary | ICD-10-CM

## 2016-11-04 DIAGNOSIS — M5136 Other intervertebral disc degeneration, lumbar region: Secondary | ICD-10-CM | POA: Diagnosis not present

## 2016-11-04 DIAGNOSIS — M170 Bilateral primary osteoarthritis of knee: Secondary | ICD-10-CM

## 2016-11-04 DIAGNOSIS — M19042 Primary osteoarthritis, left hand: Secondary | ICD-10-CM | POA: Diagnosis not present

## 2016-11-04 DIAGNOSIS — M0609 Rheumatoid arthritis without rheumatoid factor, multiple sites: Secondary | ICD-10-CM

## 2016-11-04 DIAGNOSIS — M797 Fibromyalgia: Secondary | ICD-10-CM

## 2016-11-04 DIAGNOSIS — M19041 Primary osteoarthritis, right hand: Secondary | ICD-10-CM

## 2016-11-28 ENCOUNTER — Other Ambulatory Visit: Payer: Self-pay | Admitting: Rheumatology

## 2016-11-28 MED ORDER — CYCLOBENZAPRINE HCL 10 MG PO TABS: 10.0000 mg | ORAL_TABLET | Freq: Every evening | ORAL | 2 refills | Status: DC | PRN

## 2016-11-28 NOTE — Telephone Encounter (Signed)
Patient request rf on Flexeril 5mg . Patient uses [facility] on [address]. Please call patient when called in.

## 2016-11-28 NOTE — Telephone Encounter (Signed)
Last Visit: 11/04/16 Next Visit: 04/06/17  Okay to refill Flexeril?

## 2016-11-28 NOTE — Telephone Encounter (Signed)
ok 

## 2016-12-29 ENCOUNTER — Other Ambulatory Visit: Payer: Self-pay | Admitting: Obstetrics and Gynecology

## 2016-12-29 DIAGNOSIS — R922 Inconclusive mammogram: Secondary | ICD-10-CM

## 2016-12-29 DIAGNOSIS — N63 Unspecified lump in unspecified breast: Secondary | ICD-10-CM

## 2016-12-30 ENCOUNTER — Ambulatory Visit
Admission: RE | Admit: 2016-12-30 | Discharge: 2016-12-30 | Disposition: A | Discharge status: Home / Self Care | Payer: Medicare HMO | Source: Ambulatory Visit | Attending: Obstetrics and Gynecology | Admitting: Obstetrics and Gynecology

## 2016-12-30 DIAGNOSIS — R922 Inconclusive mammogram: Secondary | ICD-10-CM

## 2016-12-30 DIAGNOSIS — N63 Unspecified lump in unspecified breast: Secondary | ICD-10-CM

## 2017-03-31 ENCOUNTER — Telehealth: Payer: Self-pay | Admitting: Rheumatology

## 2017-03-31 MED ORDER — CYCLOBENZAPRINE HCL 10 MG PO TABS: 10.0000 mg | ORAL_TABLET | Freq: Every evening | ORAL | 2 refills | Status: DC | PRN

## 2017-03-31 NOTE — Telephone Encounter (Signed)
Patient requesting a refill on Flexeril. Patient would like it sent into a different pharmacy. New pharmacy [facility] in [place] . Phone# [phone]

## 2017-03-31 NOTE — Telephone Encounter (Signed)
Patient left a message requesting a refill on Flexeril. Please call patient to advise.

## 2017-03-31 NOTE — Telephone Encounter (Signed)
Last Visit: 11/04/16 Next Visit: 04/08/17  Okay to refill per Dr. Corliss Skainseveshwar

## 2017-04-01 NOTE — Telephone Encounter (Signed)
Patient advised prescription for the Flexeril was sent to the pharmacy on 03/31/17

## 2017-04-01 NOTE — Progress Notes (Deleted)
Office Visit Note  Patient: Laura Medina             Date of Birth: [birth year]           MRN: [id number]             PCP: Knox RoyaltyJones, Enrico, MD Referring: Knox RoyaltyJones, Enrico, MD Visit Date: 04/08/2017 Occupation: @GUAROCC @    Subjective:  No chief complaint on file.   History of Present Illness: Laura Medina is a 51 y.o. female ***   Activities of Daily Living:  Patient reports morning stiffness for *** {minute/hour:19697}.   Patient {ACTIONS;DENIES/REPORTS:21021675::"Denies"} nocturnal pain.  Difficulty dressing/grooming: {ACTIONS;DENIES/REPORTS:21021675::"Denies"} Difficulty climbing stairs: {ACTIONS;DENIES/REPORTS:21021675::"Denies"} Difficulty getting out of chair: {ACTIONS;DENIES/REPORTS:21021675::"Denies"} Difficulty using hands for taps, buttons, cutlery, and/or writing: {ACTIONS;DENIES/REPORTS:21021675::"Denies"}   No Rheumatology ROS completed.   PMFS History:  Patient Active Problem List   Diagnosis Date Noted  . 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 High risk medication use 07/01/2016  . Osteoarthritis, 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  . Osteoarthritis, knee 07/01/2016  . DDD (degenerative disc disease), lumbar 07/01/2016  . Fibromyalgia 02/16/2013  . 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 Disturbance of skin sensation 02/16/2013  . Pain in limb 02/16/2013    Past Medical History:  Diagnosis Date  . Arthritis   . Fibromyalgia     Family History  Problem Relation Age of Onset  . Lung cancer Paternal Grandmother    Past Surgical History:  Procedure Laterality Date  . blood clot evacuation     left leg  . CHOLECYSTECTOMY    . EXPLORATORY LAPAROTOMY    . HEMORRHOID SURGERY     Social History   Social History Narrative  . No narrative on file     Objective: Vital Signs: There were no vitals taken for this visit.   Physical Exam   Musculoskeletal Exam: ***  CDAI Exam: No CDAI exam completed.     Investigation: No additional findings. CBC Latest Ref Rng & Units 09/01/2016 06/27/2016 08/07/2015  WBC 3.8 - 10.8 K/uL 5.0 4.6 3.8(L)  Hemoglobin 11.7 - 15.5 g/dL 78.213.9 95.614.2 21.314.5  Hematocrit 35.0 - 45.0 % 41.3 42.2 43.6  Platelets 140 - 400 K/uL 233 245 253   CMP Latest Ref Rng & Units 09/01/2016 06/27/2016 08/07/2015  Glucose 65 - 99 mg/dL 84 82 88  BUN 7 - 25 mg/dL 12 14 13   Creatinine 0.50 - 1.10 mg/dL 0.860.90 5.780.83 4.690.93  Sodium 135 - 146 mmol/L 139 137 137  Potassium 3.5 - 5.3 mmol/L 3.9 4.3 4.2  Chloride 98 - 110 mmol/L 104 102 106  CO2 20 - 31 mmol/L 29 26 24   Calcium 8.6 - 10.2 mg/dL 9.4 9.6 8.9  Total Protein 6.1 - 8.1 g/dL 7.4 7.4 -  Total Bilirubin 0.2 - 1.2 mg/dL 0.6 0.5 -  Alkaline Phos 33 - 115 U/L 44 51 -  AST 10 - 35 U/L 15 17 -  ALT 6 - 29 U/L 15 14 -    Imaging: No results found.  Speciality Comments: No specialty comments available.    Procedures:  No procedures performed Allergies: Gabapentin; Lyrica [pregabalin]; Morphine; Quinine derivatives; Topamax [topiramate]; Hydrocodone; Oxycodone; and Penicillins   Assessment / Plan:     Visit Diagnoses: No diagnosis found.    Orders: No orders of the defined types were placed in this encounter.  No orders of the defined types were placed in this encounter.   Face-to-face time spent with patient was *** minutes. 50% of time  was spent in counseling and coordination of care.  Follow-Up Instructions: No Follow-up on file.   Earnestine Mealing, NT  Note - This record has been created using [employer].  Chart creation errors have been sought, but may not always  have been located. Such creation errors do not reflect on  the standard of medical care.

## 2017-04-08 ENCOUNTER — Ambulatory Visit: Payer: Medicare HMO | Admitting: Rheumatology

## 2020-05-22 ENCOUNTER — Other Ambulatory Visit: Payer: Self-pay | Admitting: Sports Medicine

## 2020-05-22 DIAGNOSIS — M545 Low back pain, unspecified: Secondary | ICD-10-CM

## 2020-06-10 ENCOUNTER — Ambulatory Visit
Admission: RE | Admit: 2020-06-10 | Discharge: 2020-06-10 | Disposition: A | Discharge status: Home / Self Care | Payer: Medicare HMO | Source: Ambulatory Visit | Attending: Sports Medicine | Admitting: Sports Medicine

## 2020-06-10 ENCOUNTER — Other Ambulatory Visit: Payer: Self-pay

## 2020-06-10 DIAGNOSIS — M545 Low back pain, unspecified: Secondary | ICD-10-CM

## 2021-01-31 ENCOUNTER — Encounter (HOSPITAL_BASED_OUTPATIENT_CLINIC_OR_DEPARTMENT_OTHER): Payer: Self-pay | Admitting: *Deleted

## 2021-01-31 ENCOUNTER — Emergency Department (HOSPITAL_BASED_OUTPATIENT_CLINIC_OR_DEPARTMENT_OTHER)
Admission: EM | Admit: 2021-01-31 | Discharge: 2021-01-31 | Disposition: A | Discharge status: Home / Self Care | Payer: Medicare HMO | Attending: Emergency Medicine | Admitting: Emergency Medicine

## 2021-01-31 ENCOUNTER — Emergency Department (HOSPITAL_BASED_OUTPATIENT_CLINIC_OR_DEPARTMENT_OTHER): Payer: Medicare HMO

## 2021-01-31 ENCOUNTER — Other Ambulatory Visit: Payer: Self-pay

## 2021-01-31 DIAGNOSIS — M170 Bilateral primary osteoarthritis of knee: Secondary | ICD-10-CM | POA: Diagnosis not present

## 2021-01-31 DIAGNOSIS — M8949 Other hypertrophic osteoarthropathy, multiple sites: Secondary | ICD-10-CM

## 2021-01-31 DIAGNOSIS — M25561 Pain in right knee: Secondary | ICD-10-CM | POA: Diagnosis present

## 2021-01-31 DIAGNOSIS — M159 Polyosteoarthritis, unspecified: Secondary | ICD-10-CM

## 2021-01-31 NOTE — ED Provider Notes (Signed)
[facility] EMERGENCY DEPARTMENT Provider Note   CSN: [id number] Arrival date & time: 01/31/21  1325     History Chief Complaint  Patient presents with   Knee Pain    Laura Medina is a 55 y.o. female.  k  The history is provided by the patient. No language interpreter was used.  Knee Pain Location:  Knee Time since incident:  1 week Injury: no   Knee location:  L knee and R knee Pain details:    Quality:  Aching   Severity:  Moderate   Onset quality:  Gradual   Duration:  1 week   Timing:  Constant   Progression:  Worsening Chronicity:  New Foreign body present:  No foreign bodies Relieved by:  Nothing Worsened by:  Nothing Ineffective treatments:  None tried Associated symptoms: swelling       Past Medical History:  Diagnosis Date   Arthritis    Fibromyalgia     Patient Active Problem List   Diagnosis Date Noted   Rheumatoid arthritis of multiple sites without rheumatoid factor (HCC) 07/01/2016   High risk medication use 07/01/2016   Osteoarthritis, 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   Osteoarthritis, knee 07/01/2016   DDD (degenerative disc disease), lumbar 07/01/2016   Fibromyalgia 02/16/2013   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     Family History  Problem Relation Age of Onset   Lung cancer Paternal Grandmother     Social History   Tobacco Use   Smoking status: Never   Smokeless tobacco: Never  Vaping Use   Vaping Use: Never used  Substance Use Topics   Alcohol use: No   Drug use: No    Home Medications Prior to Admission medications   Medication Sig Start Date End Date Taking? Authorizing Provider  Azelastine HCl 0.15 % SOLN  07/01/16   [provider]  Cholecalciferol (D3-50) 50000 units capsule TAKE 1 CAPSULE BY MOUTH WEEKLY FOR 3 MONTHS 04/08/16   [provider]  cholecalciferol (VITAMIN D) 1000 units tablet Take 1,000 Units by mouth daily.    [provider]  cyclobenzaprine (FLEXERIL) 10 MG tablet Take 1 tablet (10 mg total) by mouth at bedtime as needed. 03/31/17   Pollyann Savoy, MD  hydroxychloroquine (PLAQUENIL) 200 MG tablet Take 200 mg by mouth 2 (two) times daily.    [provider]  RESTASIS 0.05 % ophthalmic emulsion  04/28/16   [provider]  traMADol (ULTRAM) 50 MG tablet Take 50 mg by mouth every 6 (six) hours as needed.    [provider]  valACYclovir (VALTREX) 1000 MG tablet  04/08/16   [provider]    Allergies    Buprenorphine, Gabapentin, Lyrica [pregabalin], Morphine, Nucynta [tapentadol], Quinine derivatives, Topamax [topiramate], Hydrocodone, Oxycodone, and Penicillins  Review of Systems   Review of Systems  Musculoskeletal:  Positive for joint swelling.  All other systems reviewed and are negative.  Physical Exam Updated Vital Signs BP 119/77 (BP Location: Left Arm)   Pulse 95   Temp 98.5 F (36.9 C) (Oral)   Resp 20   Ht 5\' 5"  (1.651 m)   Wt 102.1  kg   LMP 10/27/2016 (Exact Date)   SpO2 100%   BMI 37.44 kg/m   Physical Exam Vitals and nursing note reviewed.  Constitutional:      Appearance: She is well-developed.  HENT:     Head: Normocephalic.  Cardiovascular:     Rate and Rhythm: Normal rate.  Pulmonary:     Effort: Pulmonary effort is normal.  Abdominal:     General: There is no distension.  Musculoskeletal:        General: Swelling and tenderness present. No deformity.     Cervical back: Normal range of motion.     Comments: Pain with movement of both knees nv and ns intact   Skin:    General: Skin is warm.  Neurological:     General: No focal deficit present.     Mental Status: She is alert and oriented to  person, place, and time.  Psychiatric:        Mood and Affect: Mood normal.    ED Results / Procedures / Treatments   Labs (all labs ordered are listed, but only abnormal results are displayed) Labs Reviewed - No data to display  EKG None  Radiology DG Knee 2 Views Left  Result Date: 01/31/2021 CLINICAL DATA:  Bilateral knee pain for 2 days.  Left knee swelling. EXAM: LEFT KNEE - 1-2 VIEW COMPARISON:  None. FINDINGS: No evidence of fracture, dislocation, or joint effusion. Joint spaces are preserved. Mild tricompartmental peripheral spurring. Mild spurring of the tibial spines. Probable subchondral cyst in the upper central patella. Tiny quadriceps and patellar tendon enthesophytes. Soft tissues are unremarkable. IMPRESSION: Mild tricompartmental osteoarthritis. No acute osseous abnormality. Electronically Signed   By: Narda Rutherford M.D.   On: 01/31/2021 14:58   DG Knee 2 Views Right  Result Date: 01/31/2021 CLINICAL DATA:  Bilateral knee pain for 2 days. EXAM: RIGHT KNEE - 1-2 VIEW COMPARISON:  None. FINDINGS: No evidence of fracture, dislocation, or joint effusion. Normal joint spaces and alignment. Mild tricompartmental peripheral spurring and spurring of the tibial spines. Probable subchondral cystic change in the central upper patella. Small quadriceps tendon enthesophyte. Soft tissues are unremarkable. IMPRESSION: Mild tricompartmental osteoarthritis. No acute osseous abnormality. Electronically Signed   By: Narda Rutherford M.D.   On: 01/31/2021 15:00    Procedures Procedures   Medications Ordered in ED Medications - No data to display  ED Course  I have reviewed the triage vital signs and the nursing notes.  Pertinent labs & imaging results that were available during my care of the patient were reviewed by me and considered in my medical decision making (see chart for details).    MDM Rules/Calculators/A&P                          MDM:  Jillyn Hidden show tricompartmental  arthritis  Final Clinical Impression(s) / ED Diagnoses Final diagnoses:  Primary osteoarthritis involving multiple joints    Rx / DC Orders ED Discharge Orders     None     An After Visit Summary was printed and given to the patient.    Elson Areas, PA-C 01/31/21 1621    Vanetta Mulders, MD 02/01/21 [phone]

## 2021-01-31 NOTE — Discharge Instructions (Addendum)
Schedule to see your Orthopaedist for evaluation.  Rotate ice and heat  Voltaren gel,  Ibuprofen as needed.

## 2021-01-31 NOTE — ED Triage Notes (Signed)
Chronic knee pain. Hx of arthritis. Worsened pain x 2 days.

## 2021-02-26 ENCOUNTER — Other Ambulatory Visit: Payer: Self-pay

## 2021-02-26 ENCOUNTER — Encounter (HOSPITAL_BASED_OUTPATIENT_CLINIC_OR_DEPARTMENT_OTHER): Payer: Self-pay | Admitting: *Deleted

## 2021-02-26 ENCOUNTER — Emergency Department (HOSPITAL_BASED_OUTPATIENT_CLINIC_OR_DEPARTMENT_OTHER)
Admission: EM | Admit: 2021-02-26 | Discharge: 2021-02-26 | Disposition: A | Discharge status: Home / Self Care | Payer: Medicare HMO | Attending: Emergency Medicine | Admitting: Emergency Medicine

## 2021-02-26 DIAGNOSIS — L539 Erythematous condition, unspecified: Secondary | ICD-10-CM | POA: Insufficient documentation

## 2021-02-26 DIAGNOSIS — J020 Streptococcal pharyngitis: Secondary | ICD-10-CM

## 2021-02-26 DIAGNOSIS — J029 Acute pharyngitis, unspecified: Secondary | ICD-10-CM | POA: Diagnosis not present

## 2021-02-26 MED ORDER — ALUM & MAG HYDROXIDE-SIMETH 200-200-20 MG/5ML PO SUSP
30.0000 mL | Freq: Once | ORAL | Status: AC
  Administered 2021-02-26: 30 mL via ORAL
  Filled 2021-02-26: qty 30

## 2021-02-26 MED ORDER — LIDOCAINE VISCOUS HCL 2 % MT SOLN
15.0000 mL | Freq: Once | OROMUCOSAL | Status: AC
  Administered 2021-02-26: 15 mL via OROMUCOSAL
  Filled 2021-02-26: qty 15

## 2021-02-26 MED ORDER — FAMOTIDINE 20 MG PO TABS
40.0000 mg | ORAL_TABLET | Freq: Once | ORAL | Status: AC
  Administered 2021-02-26: 40 mg via ORAL
  Filled 2021-02-26: qty 2

## 2021-02-26 NOTE — ED Triage Notes (Signed)
C/o sore throat x 2 days.

## 2021-02-26 NOTE — Discharge Instructions (Signed)
I did review your CAT scan from last week this did not show any acute findings in the abdomen that could explain your symptoms.  I do think this may be coming from an ulcer in your stomach.  I would continue your medication at home he can also add Pepcid.  Please make sure to follow-up with GI doctor.  You develop any chest pain, shortness breath, worsening pain return to the ER.

## 2021-02-26 NOTE — ED Provider Notes (Signed)
[facility] EMERGENCY DEPARTMENT Provider Note   CSN: [id number] Arrival date & time: 02/26/21  1536     History Chief Complaint  Patient presents with   Sore Throat    Laura Medina is a 55 y.o. female.  HPI 55 year old female presents to the emergency department today for evaluation of sore throat.  Patient reports episode of burning pain in her throat and her stomach.  Patient reports that she is on acid medication including pantoprazole and Carafate.  She is being seen by GI doctor and had a CAT scan performed last week of pain to her right upper quadrant and right flank.  She does not know the results of the scans.  Patient states that the pain is still located there and she believes something is going on but what she was more concerned was a sore throat and acid in her throat today.  Patient states that the sore throat acid became worse today despite her medications.  Patient denies any fevers or chills.  She reports some nausea without vomiting.  No diarrhea.  No chest pain or shortness of breath.    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     Family History  Problem Relation Age of Onset   Lung cancer Paternal Grandmother     Social History   Tobacco Use   Smoking status: Never    Smokeless tobacco: Never  Vaping Use   Vaping Use: Never used  Substance Use Topics   Alcohol use: No   Drug use: No    Home Medications Prior to Admission medications   Medication Sig Start Date End Date Taking? Authorizing Provider  Azelastine HCl 0.15 % SOLN  07/01/16   [provider]  Cholecalciferol (D3-50) 50000 units capsule TAKE 1 CAPSULE BY MOUTH WEEKLY FOR 3 MONTHS 04/08/16   [provider]  cholecalciferol (VITAMIN D) 1000 units tablet Take 1,000 Units by mouth daily.    [provider]  cyclobenzaprine (FLEXERIL) 10 MG tablet Take 1 tablet (10 mg total) by mouth at bedtime as needed. 03/31/17   Pollyann Savoy, MD  hydroxychloroquine (PLAQUENIL) 200 MG tablet Take 200 mg by mouth 2 (two) times daily.    [provider]  RESTASIS 0.05 % ophthalmic emulsion  04/28/16   [provider]  traMADol (ULTRAM) 50 MG tablet Take 50 mg by mouth every 6 (six) hours as needed.    [provider]  valACYclovir (VALTREX) 1000 MG tablet  04/08/16   [provider]    Allergies    Buprenorphine, Gabapentin, Lyrica [pregabalin], Morphine, Nucynta [tapentadol], Quinine derivatives, Topamax [topiramate], Hydrocodone, Oxycodone, and Penicillins  Review of Systems   Review of Systems  Constitutional:  Negative for chills and fever.  HENT:  Positive for sore throat. Negative for congestion and trouble swallowing.   Eyes:  Negative for discharge.  Respiratory:  Negative for cough.   Gastrointestinal:  Positive for nausea. Negative for diarrhea.  Genitourinary:  Negative for difficulty urinating, dysuria, flank pain, hematuria and urgency.  Musculoskeletal:  Negative for arthralgias.  Neurological:  Negative for headaches.   Physical Exam Updated Vital Signs BP 136/86   Pulse 93   Temp 98.7 F (37.1 C) (Oral)   Resp 16   Ht 5\' 5"  (1.651 m)   Wt 98.4 kg   LMP 10/27/2016 (Exact Date)   SpO2 100%   BMI 36.11 kg/m   Physical  Exam Vitals and nursing note reviewed.  Constitutional:      General: She is not in acute distress.    Appearance: She is well-developed. She is not ill-appearing or toxic-appearing.  HENT:     Head: Normocephalic and atraumatic.     Mouth/Throat:     Mouth: Mucous membranes are moist.     Pharynx: Oropharynx is clear. Uvula midline. Posterior oropharyngeal erythema present. No pharyngeal swelling, oropharyngeal exudate or uvula swelling.     Tonsils: No tonsillar exudate or tonsillar abscesses. 1+ on the right. 1+ on the left.  Eyes:     General: No scleral icterus.       Right eye: No discharge.        Left eye: No discharge.  Pulmonary:     Effort: No respiratory distress.  Musculoskeletal:        General: Normal range of motion.     Cervical back: Normal range of motion.  Lymphadenopathy:     Cervical: No cervical adenopathy.  Skin:    General: Skin is warm and dry.     Capillary Refill: Capillary refill takes less than 2 seconds.     Coloration: Skin is not pale.  Neurological:     Mental Status: She is alert.  Psychiatric:        Mood and Affect: Mood normal.        Behavior: Behavior normal.        Thought Content: Thought content normal.        Judgment: Judgment normal.    ED Results / Procedures / Treatments   Labs (all labs ordered are listed, but only abnormal results are displayed) Labs Reviewed - No data to display  EKG None  Radiology No results found.  Procedures Procedures   Medications Ordered in ED Medications  lidocaine (XYLOCAINE) 2 % viscous mouth solution 15 mL (15 mLs Mouth/Throat Given 02/26/21 1631)  alum & mag hydroxide-simeth (MAALOX/MYLANTA) 200-200-20 MG/5ML suspension 30 mL (30 mLs Oral Given 02/26/21 1631)  famotidine (PEPCID) tablet 40 mg (40 mg Oral Given 02/26/21 1630)    ED Course  I have reviewed the triage vital signs and the nursing notes.  Pertinent labs & imaging results that were available during my care of the patient  were reviewed by me and considered in my medical decision making (see chart for details).    MDM Rules/Calculators/A&P                           55 year old presents the ER for sore throat and concern for acid burning.  Patient states that she is being seen by GI.  She had a CT scan performed last week pain to right upper quadrant.  She is unsure of these results.  I did review patient's results  and the care everywhere that shows no acute findings in the abdomen or pelvis.  Patient does have a longstanding history of acid reflux.  She is on Carafate and pantoprazole.  Patient does have some mild erythema.  There is no exudates or swelling.  No signs peritonsillar abscess or deep space infection.  Low suspicion for strep pharyngitis and low Centor criteria.  Patient was given GI cocktail in the ER.  She has some improvement in symptoms.  EKG performed shows normal sinus rhythm with a heart rate of 90 bpm without any acute ischemic changes and appears similar to prior tracing 5 years ago.  Patient presentation not consistent with any cardiac or pulmonary abnormality including ACS, PE, dissection.  Given that she recent had a CT scan did not feel this needs to be repeated in the ER today.  I suspect the patient's sore throat is likely secondary to GERD.  Patient encouraged to add famotidine to her regimen.  Will need GI follow-up.  Discussed reasons she should return to the ER.  Pt is hemodynamically stable, in NAD, & able to ambulate in the ED. Evaluation does not show pathology that would require ongoing emergent intervention or inpatient treatment. I explained the diagnosis to the patient. Pain has been managed & has no complaints prior to dc. Pt is comfortable with above plan and is stable for discharge at this time. All questions were answered prior to disposition. Strict return precautions for f/u to the ED were discussed. Encouraged follow up with PCP.  Final Clinical Impression(s) / ED  Diagnoses Final diagnoses:  Strep throat    Rx / DC Orders ED Discharge Orders     None        Rise Mu, PA-C 02/26/21 1729    Melene Plan, DO 02/26/21 2247

## 2021-07-22 ENCOUNTER — Encounter (HOSPITAL_BASED_OUTPATIENT_CLINIC_OR_DEPARTMENT_OTHER): Payer: Self-pay | Admitting: *Deleted

## 2021-07-22 ENCOUNTER — Other Ambulatory Visit: Payer: Self-pay

## 2021-07-22 ENCOUNTER — Emergency Department (HOSPITAL_BASED_OUTPATIENT_CLINIC_OR_DEPARTMENT_OTHER)
Admission: EM | Admit: 2021-07-22 | Discharge: 2021-07-22 | Disposition: A | Discharge status: Home / Self Care | Payer: Medicare HMO | Attending: Emergency Medicine | Admitting: Emergency Medicine

## 2021-07-22 DIAGNOSIS — R0981 Nasal congestion: Secondary | ICD-10-CM | POA: Diagnosis not present

## 2021-07-22 DIAGNOSIS — U071 COVID-19: Secondary | ICD-10-CM | POA: Insufficient documentation

## 2021-07-22 DIAGNOSIS — R000 Tachycardia, unspecified: Secondary | ICD-10-CM | POA: Insufficient documentation

## 2021-07-22 DIAGNOSIS — R509 Fever, unspecified: Secondary | ICD-10-CM | POA: Diagnosis present

## 2021-07-22 LAB — RESP PANEL BY RT-PCR (FLU A&B, COVID) ARPGX2
Influenza A by PCR: NEGATIVE
Influenza B by PCR: NEGATIVE
SARS Coronavirus 2 by RT PCR: POSITIVE — AB

## 2021-07-22 MED ORDER — NIRMATRELVIR/RITONAVIR (PAXLOVID)TABLET: 3.0000 | ORAL_TABLET | Freq: Two times a day (BID) | ORAL | 0 refills | Status: AC

## 2021-07-22 NOTE — ED Triage Notes (Signed)
Headache and cough. She was treated for a sinus infection last week.

## 2021-07-22 NOTE — Discharge Instructions (Addendum)
You have covid. Quarentine for 5 days at home. You can return to work in 5 days.   For fever, HA, sore throat or body aches - take tylenol [phone] mg) every 8 hours. Do not exceed 3000mg /day. You ca also take ibuprofen.   You can also use cough drops or drink honey.  Return if things worsen or change.

## 2021-07-22 NOTE — ED Provider Notes (Addendum)
[facility] EMERGENCY DEPARTMENT Provider Note   CSN: [id number] Arrival date & time: 07/22/21  1302     History Chief Complaint  Patient presents with   URI    Laura Medina is a 55 y.o. female.   URI Presenting symptoms: congestion and fever   Presenting symptoms: no sore throat   Associated symptoms: headaches and myalgias    Patient with history of arthritis and fibromyalgia presents due to cough, headache, nasal congestion x1 day.  She is COVID vaccinated and boosted.  She has been taking Tylenol at home which has alleviated the symptoms somewhat.  No obvious provoking symptoms.  No shortness of breath or chest pain.  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     Family History  Problem Relation Age of Onset   Lung cancer Paternal Grandmother     Social History   Tobacco Use   Smoking status: Never   Smokeless tobacco: Never  Vaping Use   Vaping Use: Never used  Substance Use Topics   Alcohol use: No   Drug use: No    Home Medications Prior to Admission medications   Medication Sig Start Date End Date Taking? Authorizing Provider  Azelastine HCl 0.15 % SOLN  07/01/16   [provider]  Cholecalciferol (D3-50) 50000 units capsule TAKE 1 CAPSULE BY MOUTH WEEKLY FOR  3 MONTHS 04/08/16   [provider]  cholecalciferol (VITAMIN D) 1000 units tablet Take 1,000 Units by mouth daily.    [provider]  cyclobenzaprine (FLEXERIL) 10 MG tablet Take 1 tablet (10 mg total) by mouth at bedtime as needed. 03/31/17   Pollyann Savoy, MD  hydroxychloroquine (PLAQUENIL) 200 MG tablet Take 200 mg by mouth 2 (two) times daily.    [provider]  RESTASIS 0.05 % ophthalmic emulsion  04/28/16   [provider]  traMADol (ULTRAM) 50 MG tablet Take 50 mg by mouth every 6 (six) hours as needed.    [provider]  valACYclovir (VALTREX) 1000 MG tablet  04/08/16   [provider]    Allergies    Buprenorphine, Gabapentin, Lyrica [pregabalin], Morphine, Nucynta [tapentadol], Quinine derivatives, Topamax [topiramate], Hydrocodone, Oxycodone, and Penicillins  Review of Systems   Review of Systems  Constitutional:  Positive for fever.  HENT:  Positive for congestion. Negative for sore throat.   Respiratory:  Negative for shortness of breath.   Cardiovascular:  Negative for chest pain.  Musculoskeletal:  Positive for myalgias.  Neurological:  Positive for headaches.   Physical Exam Updated Vital Signs BP 134/87 (BP Location: Right Arm)    Pulse (!) 101    Temp 98.2 F (36.8 C) (Oral)    Resp 18    Ht 5\' 5"  (1.651  m)    Wt 98.4 kg    LMP 10/27/2016 (Exact Date)    SpO2 96%    BMI 36.10 kg/m   Physical Exam Vitals and nursing note reviewed. Exam conducted with a chaperone present.  Constitutional:      Appearance: Normal appearance.  HENT:     Head: Normocephalic.     Nose: Congestion present.     Mouth/Throat:     Pharynx: No posterior oropharyngeal erythema.  Eyes:     Extraocular Movements: Extraocular movements intact.     Pupils: Pupils are equal, round, and reactive to light.  Cardiovascular:     Rate and Rhythm: Regular rhythm. Tachycardia present.     Comments: Slight tachycardia, regular rhythm without  any murmurs rubs or gallops Pulmonary:     Effort: Pulmonary effort is normal.     Breath sounds: Normal breath sounds.     Comments: Lungs CTA bilaterally. No accessory muscle use. Speaking in complete sentences.  Abdominal:     General: Abdomen is flat.     Palpations: Abdomen is soft.  Musculoskeletal:     Cervical back: Normal range of motion.  Neurological:     Mental Status: She is alert.  Psychiatric:        Mood and Affect: Mood normal.    ED Results / Procedures / Treatments   Labs (all labs ordered are listed, but only abnormal results are displayed) Labs Reviewed  RESP PANEL BY RT-PCR (FLU A&B, COVID) ARPGX2 - Abnormal; Notable for the following components:      Result Value   SARS Coronavirus 2 by RT PCR POSITIVE (*)    All other components within normal limits    EKG None  Radiology No results found.  Procedures Procedures   Medications Ordered in ED Medications - No data to display  ED Course  I have reviewed the triage vital signs and the nursing notes.  Pertinent labs & imaging results that were available during my care of the patient were reviewed by me and considered in my medical decision making (see chart for details).    MDM Rules/Calculators/A&P                         PE and history suspicious for URI. Viral panel notable for Covid +   No signs of respiratory distress. No hypoxia, mild tachycardia. Lungs CTA bilaterally. Doubt underlying cardiopulmonary process.  I considered, but think unlikely, dangerous causes of this patients symptoms to include ACS, CHF or COPD exacerbations, pneumonia, pneumothorax.  Patient is nontoxic appearing and not in need of emergent medical intervention.  Last CMP in October 2022 with GFR>60. Will prescribe paxlovid.   Final Clinical Impression(s) / ED Diagnoses Final diagnoses:  None    Rx / DC Orders ED Discharge Orders     None        Theron Arista, PA-C 07/22/21 1602    Theron Arista,  PA-C 07/22/21 1645    Gloris Manchester, MD 07/24/21 [phone]

## 2021-08-21 ENCOUNTER — Other Ambulatory Visit: Payer: Self-pay | Admitting: Physical Medicine & Rehabilitation

## 2021-08-21 DIAGNOSIS — M5412 Radiculopathy, cervical region: Secondary | ICD-10-CM

## 2021-08-21 DIAGNOSIS — M5416 Radiculopathy, lumbar region: Secondary | ICD-10-CM

## 2021-09-09 ENCOUNTER — Ambulatory Visit
Admission: RE | Admit: 2021-09-09 | Discharge: 2021-09-09 | Disposition: A | Discharge status: Home / Self Care | Payer: Medicare HMO | Source: Ambulatory Visit | Attending: Physical Medicine & Rehabilitation | Admitting: Physical Medicine & Rehabilitation

## 2021-09-09 ENCOUNTER — Other Ambulatory Visit: Payer: Self-pay

## 2021-09-09 DIAGNOSIS — M5416 Radiculopathy, lumbar region: Secondary | ICD-10-CM

## 2021-09-09 DIAGNOSIS — M5412 Radiculopathy, cervical region: Secondary | ICD-10-CM

## 2021-09-25 ENCOUNTER — Other Ambulatory Visit: Payer: Self-pay

## 2021-09-25 ENCOUNTER — Encounter: Payer: Self-pay | Admitting: Orthopaedic Surgery

## 2021-09-25 ENCOUNTER — Ambulatory Visit (INDEPENDENT_AMBULATORY_CARE_PROVIDER_SITE_OTHER): Payer: Medicare HMO | Admitting: Orthopaedic Surgery

## 2021-09-25 VITALS — BP 108/72 | HR 91 | Ht 65.0 in | Wt 215.0 lb

## 2021-09-25 DIAGNOSIS — M79609 Pain in unspecified limb: Secondary | ICD-10-CM

## 2021-09-25 NOTE — Progress Notes (Signed)
? ?Office Visit Note ?  ?Patient: Laura Medina           ?Date of Birth: 03/16/1966           ?MRN: [id number] ?Visit Date: 09/25/2021 ?             ?Requested by: Drema Halon, FNP ?[id number] ?[address] ?[place],  [place] [address] ?PCP: Drema Halon, FNP ? ? ?Assessment & Plan: ?Visit Diagnoses:  ?1. Pain in extremity, unspecified extremity   ?           Neck pain. LBP. ? ?Plan: Cervical and lumbar images MRI reviewed from 09/10/2021.  Pathophysiology discussed.  She does not have any areas of significant compression.  She will work on a walking program, stretching, gradual weight loss. ? ?Follow-Up Instructions: Return if symptoms worsen or fail to improve.  ? ?Orders:  ?No orders of the defined types were placed in this encounter. ? ?No orders of the defined types were placed in this encounter. ? ? ? ? Procedures: ?No procedures performed ? ? ?Clinical Data: ?No additional findings. ? ? ?Subjective: ?Chief Complaint  ?Patient presents with  ? Neck - Pain  ? Lower Back - Pain  ? ? ?HPI 56 year old female with chronic greater than a year history of neck pain and upper lumbar pain.  She has pain in the lumbar spine that radiates into the buttocks down the right leg to her foot.  She had numbness and tingling in her foot has been treated with tramadol and Flexeril.  Patient states she was diagnosed with rheumatoid arthritis no hand or upper extremity digit deformity.  She has pain in her elbows right and left are equal.  Patient is right-hand dominant.  Patient states she has fibromyalgia.  History of high cholesterol also previous cholecystectomy.  Patient takes vitamin D. ? ?Review of Systems patient currently on Plaquenil. ? ? ?Objective: ?Vital Signs: BP 108/72   Pulse 91   Ht 5\' 5"  (1.651 m)   Wt 215 lb (97.5 kg)   LMP 10/27/2016 (Exact Date)   BMI 35.78 kg/m?  ? ?Physical Exam ?Constitutional:   ?   Appearance: She is well-developed.  ?HENT:  ?   Head: Normocephalic.  ?   Right Ear:  External ear normal.  ?   Left Ear: External ear normal. There is no impacted cerumen.  ?Eyes:  ?   Pupils: Pupils are equal, round, and reactive to light.  ?Neck:  ?   Thyroid: No thyromegaly.  ?   Trachea: No tracheal deviation.  ?Cardiovascular:  ?   Rate and Rhythm: Normal rate.  ?Pulmonary:  ?   Effort: Pulmonary effort is normal.  ?Abdominal:  ?   Palpations: Abdomen is soft.  ?Musculoskeletal:  ?   Cervical back: No rigidity.  ?Skin: ?   General: Skin is warm and dry.  ?Neurological:  ?   Mental Status: She is alert and oriented to person, place, and time.  ?Psychiatric:     ?   Behavior: Behavior normal.  ? ? ?Ortho Exam normal upper and lower extremity reflexes no isolated motor weakness.  Good finger extension flexion strength no ulnar drift.  No sciatic notch tenderness some pain with palpation lumbosacral junction midline and paraspinal muscles. ? ?Specialty Comments:  ?No specialty comments available. ? ?Imaging: ?Narrative & Impression  ?CLINICAL DATA:  Lumbar radiculitis M54.16 (ICD-10-CM) ?  ?EXAM: ?MRI LUMBAR SPINE WITHOUT CONTRAST ?  ?TECHNIQUE: ?Multiplanar, multisequence MR imaging of the  lumbar spine was ?performed. No intravenous contrast was administered. ?  ?COMPARISON:  06/10/2020 ?  ?FINDINGS: ?Segmentation:  Standard. ?  ?Alignment:  Grade 1 retrolisthesis at L4-5 is unchanged ?  ?Vertebrae:  No fracture, evidence of discitis, or bone lesion. ?  ?Conus medullaris and cauda equina: Conus extends to the L2 level. ?Conus and cauda equina appear normal. ?  ?Paraspinal and other soft tissues: Negative ?  ?Disc levels: ?  ?L1-L2: Normal disc space and facet joints. No spinal canal stenosis. ?No neural foraminal stenosis. ?  ?L2-L3: Normal disc space and facet joints. No spinal canal stenosis. ?No neural foraminal stenosis. ?  ?L3-L4: Small disc bulge, unchanged. No spinal canal stenosis. No ?neural foraminal stenosis. ?  ?L4-L5: Disc desiccation without herniation. No spinal canal ?stenosis. No  neural foraminal stenosis. ?  ?L5-S1: Normal disc space and facet joints. No spinal canal stenosis. ?No neural foraminal stenosis. ?  ?Visualized sacrum: Normal. ?  ?IMPRESSION: ?Unchanged mild degenerative disc disease of the lumbar spine without ?spinal canal or neural foraminal stenosis. ?  ?  ?Electronically Signed ?  By: Deatra Robinson M.D. ?  On: 09/10/2021 00:11 ?  ?Narrative & Impression  ?CLINICAL DATA:  Chronic neck pain.  Bilateral lower extremity pain. ?  ?Cervical radiculitis.  M 54.12 ?  ?EXAM: ?MRI CERVICAL SPINE WITHOUT CONTRAST ?  ?TECHNIQUE: ?Multiplanar, multisequence MR imaging of the cervical spine was ?performed. No intravenous contrast was administered. ?  ?COMPARISON:  02/12/2013 ?  ?FINDINGS: ?Alignment: Physiologic. ?  ?Vertebrae: No fracture, evidence of discitis, or bone lesion. ?  ?Cord: Normal signal and morphology. ?  ?Posterior Fossa, vertebral arteries, paraspinal tissues: Negative. ?  ?Disc levels: ?  ?C1-2: Unremarkable. ?  ?C2-3: Normal disc space and facet joints. There is no spinal canal ?stenosis. No neural foraminal stenosis. ?  ?C3-4: Normal disc space and facet joints. There is no spinal canal ?stenosis. No neural foraminal stenosis. ?  ?C4-5: Mild disc degeneration. There is no spinal canal stenosis. No ?neural foraminal stenosis. ?  ?C5-6: Mild disc degeneration. There is no spinal canal stenosis. No ?neural foraminal stenosis. ?  ?C6-7: Mild disc degeneration. There is no spinal canal stenosis. No ?neural foraminal stenosis. ?  ?C7-T1: Normal disc space and facet joints. There is no spinal canal ?stenosis. No neural foraminal stenosis. ?  ?IMPRESSION: ?Mild cervical degenerative disc disease without spinal canal or ?neural foraminal stenosis. ?  ?  ?Electronically Signed ?  By: Deatra Robinson M.D. ?  On: 09/09/2021 22:41  ?  ? ? ? ?PMFS History: ?Patient Active Problem List  ? Diagnosis Date Noted  ? Rheumatoid arthritis of multiple sites without rheumatoid factor (HCC)  07/01/2016  ? High risk medication use 07/01/2016  ? 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 Osteoarthritis, knee 07/01/2016  ? DDD (degenerative disc disease), lumbar 07/01/2016  ? Fibromyalgia 02/16/2013  ? 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 Disturbance of skin sensation 02/16/2013  ? Pain in limb 02/16/2013  ? ?Past Medical History:  ?Diagnosis Date  ? Arthritis   ? Fibromyalgia   ?  ?Family History  ?Problem Relation Age of Onset  ? Lung cancer Paternal Grandmother   ?  ?Past Surgical History:  ?Procedure Laterality Date  ? blood clot evacuation    ? left leg  ? CHOLECYSTECTOMY    ? EXPLORATORY LAPAROTOMY    ? HEMORRHOID SURGERY    ? ?Social History  ? ?Occupational History  ? Not on file  ?Tobacco Use  ? Smoking  status: Never  ? Smokeless tobacco: Never  ?Vaping Use  ? Vaping Use: Never used  ?Substance and Sexual Activity  ? Alcohol use: No  ? Drug use: No  ? Sexual activity: Never  ?  Birth control/protection: None  ? ? ? ? ? ? ?

## 2021-12-17 ENCOUNTER — Ambulatory Visit
Admission: EM | Admit: 2021-12-17 | Discharge: 2021-12-17 | Disposition: A | Discharge status: Home / Self Care | Payer: Medicare HMO | Attending: Urgent Care | Admitting: Urgent Care

## 2021-12-17 DIAGNOSIS — R0982 Postnasal drip: Secondary | ICD-10-CM | POA: Insufficient documentation

## 2021-12-17 DIAGNOSIS — H6122 Impacted cerumen, left ear: Secondary | ICD-10-CM | POA: Insufficient documentation

## 2021-12-17 DIAGNOSIS — K219 Gastro-esophageal reflux disease without esophagitis: Secondary | ICD-10-CM | POA: Insufficient documentation

## 2021-12-17 DIAGNOSIS — R070 Pain in throat: Secondary | ICD-10-CM | POA: Insufficient documentation

## 2021-12-17 LAB — POCT RAPID STREP A (OFFICE): Rapid Strep A Screen: NEGATIVE

## 2021-12-17 MED ORDER — CETIRIZINE HCL 10 MG PO TABS: 10.0000 mg | ORAL_TABLET | Freq: Every day | ORAL | 0 refills | Status: AC

## 2021-12-17 MED ORDER — CARBAMIDE PEROXIDE 6.5 % OT SOLN: 5.0000 [drp] | Freq: Two times a day (BID) | OTIC | 0 refills | Status: DC

## 2021-12-17 MED ORDER — PSEUDOEPHEDRINE HCL 60 MG PO TABS
60.0000 mg | ORAL_TABLET | Freq: Three times a day (TID) | ORAL | 0 refills | Status: DC | PRN
Start: 2021-12-17 — End: 2022-02-06

## 2021-12-17 MED ORDER — FAMOTIDINE 20 MG PO TABS: 20.0000 mg | ORAL_TABLET | Freq: Two times a day (BID) | ORAL | 0 refills | Status: AC

## 2021-12-17 NOTE — ED Triage Notes (Signed)
Pt c/o burning sensation in her throat and feeling nauseous.  Started: yesterday

## 2021-12-17 NOTE — ED Provider Notes (Signed)
[facility]   MRN: [id number] DOB: May 05, 1966  Subjective:   Laura Medina is a 56 y.o. female presenting for 1 day history of throat discomfort, burning sensation of her throat, nausea.  Has also had left ear fullness, decreased hearing.  No runny or stuffy nose, chest pain, shortness of breath, wheezing.  She does have a history of acid reflux and takes pantoprazole for this.  No current facility-administered medications for this encounter.  Current Outpatient Medications:    amLODipine (NORVASC) 10 MG tablet, TAKE 1 TABLET(10 MG) BY MOUTH DAILY, Disp: , Rfl:    augmented betamethasone dipropionate (DIPROLENE-AF) 0.05 % ointment, Apply to scalp 3-4 times per week, Disp: , Rfl:    Capsaicin 0.1 % CREA, Apply to bilateral knees every 8 hours as needed., Disp: , Rfl:    linaclotide (LINZESS) 145 MCG CAPS capsule, Take by mouth., Disp: , Rfl:    traMADol (ULTRAM) 50 MG tablet, Take by mouth., Disp: , Rfl:    Azelastine HCl 0.15 % SOLN, , Disp: , Rfl:    cholecalciferol (VITAMIN D) 1000 units tablet, Take 1,000 Units by mouth daily., Disp: , Rfl:    Cholecalciferol 1.25 MG (50000 UT) capsule, TAKE 1 CAPSULE BY MOUTH WEEKLY FOR 3 MONTHS, Disp: , Rfl:    cyclobenzaprine (FLEXERIL) 10 MG tablet, Take 1 tablet (10 mg total) by mouth at bedtime as needed., Disp: 30 tablet, Rfl: 2   finasteride (PROSCAR) 5 MG tablet, Take 5 mg by mouth daily., Disp: , Rfl:    hydroxychloroquine (PLAQUENIL) 200 MG tablet, Take 200 mg by mouth 2 (two) times daily., Disp: , Rfl:    RESTASIS 0.05 % ophthalmic emulsion, , Disp: , Rfl:    traMADol (ULTRAM) 50 MG tablet, Take 50 mg by mouth every 6 (six) hours as needed., Disp: , Rfl:    valACYclovir (VALTREX) 1000 MG tablet, , Disp: , Rfl:    Allergies  Allergen Reactions   Celecoxib Diarrhea and Itching   Duloxetine Itching   Hydrocodone-Acetaminophen Itching   Hydroxychloroquine Itching   Oxycodone-Acetaminophen Itching    Methotrexate Rash   Buprenorphine     itching   Gabapentin Itching   Lyrica [Pregabalin]     itching   Morphine Other (See Comments)    unknown   Nucynta [Tapentadol]     itching   Quinine Derivatives Itching   Sulfur     Burning sensation   Topamax [Topiramate] Itching and Swelling   Acetaminophen-Codeine Itching   Hydrocodone Itching   Oxycodone Itching   Penicillins Itching    Past Medical History:  Diagnosis Date   Arthritis    Fibromyalgia      Past Surgical History:  Procedure Laterality Date   blood clot evacuation     left leg   CHOLECYSTECTOMY     EXPLORATORY LAPAROTOMY     HEMORRHOID SURGERY      Family History  Problem Relation Age of Onset   Lung cancer Paternal Grandmother     Social History   Tobacco Use   Smoking status: Never   Smokeless tobacco: Never  Vaping Use   Vaping Use: Never used  Substance Use Topics   Alcohol use: No   Drug use: No    ROS   Objective:   Vitals: BP 125/85 (BP Location: Right Arm)   Pulse 88   Temp 98 F (36.7 C) (Oral)   Resp 18   LMP 10/27/2016 (Exact Date)   SpO2 98%   Physical  Exam Constitutional:      General: She is not in acute distress.    Appearance: Normal appearance. She is well-developed and normal weight. She is not ill-appearing, toxic-appearing or diaphoretic.  HENT:     Head: Normocephalic and atraumatic.     Right Ear: Tympanic membrane, ear canal and external ear normal. No drainage or tenderness. No middle ear effusion. There is no impacted cerumen. Tympanic membrane is not erythematous.     Left Ear: Tympanic membrane, ear canal and external ear normal. No drainage or tenderness.  No middle ear effusion. There is impacted cerumen. Tympanic membrane is not erythematous.     Ears:     Comments: TM clear following the ear lavage.    Nose: Nose normal. No congestion or rhinorrhea.     Mouth/Throat:     Mouth: Mucous membranes are moist. No oral lesions.     Pharynx: No pharyngeal  swelling, oropharyngeal exudate, posterior oropharyngeal erythema or uvula swelling.     Tonsils: No tonsillar exudate or tonsillar abscesses.     Comments: Thick streaks of postnasal drainage overlying pharynx. Eyes:     General: No scleral icterus.       Right eye: No discharge.        Left eye: No discharge.     Extraocular Movements: Extraocular movements intact.     Right eye: Normal extraocular motion.     Left eye: Normal extraocular motion.     Conjunctiva/sclera: Conjunctivae normal.  Cardiovascular:     Rate and Rhythm: Normal rate.  Pulmonary:     Effort: Pulmonary effort is normal.  Musculoskeletal:     Cervical back: Normal range of motion and neck supple.  Lymphadenopathy:     Cervical: No cervical adenopathy.  Skin:    General: Skin is warm and dry.  Neurological:     General: No focal deficit present.     Mental Status: She is alert and oriented to person, place, and time.  Psychiatric:        Mood and Affect: Mood normal.        Behavior: Behavior normal.    Ear lavage performed using mixture of peroxide and water.  Pressure irrigation performed using a bottle and a thin ear tube.  Left ear lavage.  No curette was used.  Results for orders placed or performed during the hospital encounter of 12/17/21 (from the past 24 hour(s))  POCT rapid strep A     Status: None   Collection Time: 12/17/21  4:08 PM  Result Value Ref Range   Rapid Strep A Screen Negative Negative    Assessment and Plan :   PDMP not reviewed this encounter.  1. Post-nasal drainage   2. Throat pain   3. Gastroesophageal reflux disease, unspecified whether esophagitis present   4. Impacted cerumen of left ear    We will address the postnasal drainage as a primary source of her symptoms with Zyrtec, pseudoephedrine, supportive care.  Advised that acid reflux can also be a source of her symptoms and therefore recommend that she continue with pantoprazole and add famotidine. Successful left  ear lavage.  General management of cerumen impaction reviewed with patient.  Anticipatory guidance provided. Counseled patient on potential for adverse effects with medications prescribed/recommended today, ER and return-to-clinic precautions discussed, patient verbalized understanding.    Wallis Bamberg, [place] 12/17/21 1630

## 2021-12-20 LAB — CULTURE, GROUP A STREP (THRC)

## 2022-01-15 ENCOUNTER — Other Ambulatory Visit: Payer: Self-pay

## 2022-01-15 ENCOUNTER — Emergency Department (HOSPITAL_COMMUNITY): Payer: Medicare HMO

## 2022-01-15 ENCOUNTER — Emergency Department (HOSPITAL_COMMUNITY)
Admission: EM | Admit: 2022-01-15 | Discharge: 2022-01-15 | Disposition: A | Discharge status: Home / Self Care | Payer: Medicare HMO | Attending: Emergency Medicine | Admitting: Emergency Medicine

## 2022-01-15 DIAGNOSIS — Z20822 Contact with and (suspected) exposure to covid-19: Secondary | ICD-10-CM | POA: Insufficient documentation

## 2022-01-15 DIAGNOSIS — S30811A Abrasion of abdominal wall, initial encounter: Secondary | ICD-10-CM | POA: Insufficient documentation

## 2022-01-15 DIAGNOSIS — R079 Chest pain, unspecified: Secondary | ICD-10-CM | POA: Insufficient documentation

## 2022-01-15 DIAGNOSIS — M542 Cervicalgia: Secondary | ICD-10-CM | POA: Diagnosis not present

## 2022-01-15 DIAGNOSIS — M25552 Pain in left hip: Secondary | ICD-10-CM | POA: Insufficient documentation

## 2022-01-15 DIAGNOSIS — Z79899 Other long term (current) drug therapy: Secondary | ICD-10-CM | POA: Insufficient documentation

## 2022-01-15 DIAGNOSIS — I10 Essential (primary) hypertension: Secondary | ICD-10-CM | POA: Insufficient documentation

## 2022-01-15 DIAGNOSIS — R930 Abnormal findings on diagnostic imaging of skull and head, not elsewhere classified: Secondary | ICD-10-CM | POA: Insufficient documentation

## 2022-01-15 DIAGNOSIS — Y92410 Unspecified street and highway as the place of occurrence of the external cause: Secondary | ICD-10-CM | POA: Diagnosis not present

## 2022-01-15 DIAGNOSIS — S3991XA Unspecified injury of abdomen, initial encounter: Secondary | ICD-10-CM | POA: Diagnosis present

## 2022-01-15 LAB — CBC WITH DIFFERENTIAL/PLATELET
Abs Immature Granulocytes: 0.03 10*3/uL (ref 0.00–0.07)
Basophils Absolute: 0 10*3/uL (ref 0.0–0.1)
Basophils Relative: 0 %
Eosinophils Absolute: 0.2 10*3/uL (ref 0.0–0.5)
Eosinophils Relative: 3 %
HCT: 40.7 % (ref 36.0–46.0)
Hemoglobin: 14.1 g/dL (ref 12.0–15.0)
Immature Granulocytes: 1 %
Lymphocytes Relative: 28 %
Lymphs Abs: 1.8 10*3/uL (ref 0.7–4.0)
MCH: 32.5 pg (ref 26.0–34.0)
MCHC: 34.6 g/dL (ref 30.0–36.0)
MCV: 93.8 fL (ref 80.0–100.0)
Monocytes Absolute: 0.5 10*3/uL (ref 0.1–1.0)
Monocytes Relative: 8 %
Neutro Abs: 3.9 10*3/uL (ref 1.7–7.7)
Neutrophils Relative %: 60 %
Platelets: 242 10*3/uL (ref 150–400)
RBC: 4.34 MIL/uL (ref 3.87–5.11)
RDW: 12.9 % (ref 11.5–15.5)
WBC: 6.4 10*3/uL (ref 4.0–10.5)
nRBC: 0 % (ref 0.0–0.2)

## 2022-01-15 LAB — URINALYSIS, ROUTINE W REFLEX MICROSCOPIC
Bilirubin Urine: NEGATIVE
Glucose, UA: NEGATIVE mg/dL
Hgb urine dipstick: NEGATIVE
Ketones, ur: NEGATIVE mg/dL
Leukocytes,Ua: NEGATIVE
Nitrite: NEGATIVE
Protein, ur: NEGATIVE mg/dL
Specific Gravity, Urine: 1.012 (ref 1.005–1.030)
pH: 7 (ref 5.0–8.0)

## 2022-01-15 LAB — COMPREHENSIVE METABOLIC PANEL
ALT: 19 U/L (ref 0–44)
AST: 23 U/L (ref 15–41)
Albumin: 3.5 g/dL (ref 3.5–5.0)
Alkaline Phosphatase: 56 U/L (ref 38–126)
Anion gap: 8 (ref 5–15)
BUN: 15 mg/dL (ref 6–20)
CO2: 25 mmol/L (ref 22–32)
Calcium: 9.6 mg/dL (ref 8.9–10.3)
Chloride: 106 mmol/L (ref 98–111)
Creatinine, Ser: 0.91 mg/dL (ref 0.44–1.00)
GFR, Estimated: >60 mL/min (ref >60)
Glucose, Bld: 89 mg/dL (ref 70–99)
Potassium: 3.8 mmol/L (ref 3.5–5.1)
Sodium: 139 mmol/L (ref 135–145)
Total Bilirubin: 0.6 mg/dL (ref 0.3–1.2)
Total Protein: 7.3 g/dL (ref 6.5–8.1)

## 2022-01-15 LAB — RESP PANEL BY RT-PCR (FLU A&B, COVID) ARPGX2
Influenza A by PCR: NEGATIVE
Influenza B by PCR: NEGATIVE
SARS Coronavirus 2 by RT PCR: NEGATIVE

## 2022-01-15 MED ORDER — IOHEXOL 300 MG/ML  SOLN
100.0000 mL | Freq: Once | INTRAMUSCULAR | Status: AC | PRN
  Administered 2022-01-15: 100 mL via INTRAVENOUS

## 2022-01-15 MED ORDER — TRAMADOL HCL 50 MG PO TABS
50.0000 mg | ORAL_TABLET | Freq: Once | ORAL | Status: AC
  Administered 2022-01-15: 50 mg via ORAL
  Filled 2022-01-15: qty 1

## 2022-01-15 MED ORDER — CYCLOBENZAPRINE HCL 10 MG PO TABS
10.0000 mg | ORAL_TABLET | Freq: Once | ORAL | Status: AC
  Administered 2022-01-15: 10 mg via ORAL
  Filled 2022-01-15: qty 1

## 2022-01-15 MED ORDER — CYCLOBENZAPRINE HCL 10 MG PO TABS: 10.0000 mg | ORAL_TABLET | Freq: Two times a day (BID) | ORAL | 0 refills | Status: AC

## 2022-01-15 MED ORDER — FENTANYL CITRATE PF 50 MCG/ML IJ SOSY
50.0000 ug | PREFILLED_SYRINGE | Freq: Once | INTRAMUSCULAR | Status: AC
  Administered 2022-01-15: 50 ug via INTRAVENOUS
  Filled 2022-01-15: qty 1

## 2022-01-15 MED ORDER — ONDANSETRON HCL 4 MG/2ML IJ SOLN
4.0000 mg | Freq: Once | INTRAMUSCULAR | Status: AC
  Administered 2022-01-15: 4 mg via INTRAVENOUS
  Filled 2022-01-15: qty 2

## 2022-01-15 MED ORDER — TRAMADOL HCL 50 MG PO TABS: 50.0000 mg | ORAL_TABLET | Freq: Four times a day (QID) | ORAL | 0 refills | Status: AC | PRN
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
## 2022-01-15 NOTE — ED Notes (Signed)
RN removed c-collar. PA aware. When RN walked in pts room, c-collar was to pts nose and not on properly.

## 2022-01-15 NOTE — Discharge Instructions (Addendum)
The images that we obtain on today's visit were all negative for any acute fracture or finding.  I will be sending you home with 2 sets of medications.  The first medication being a muscle relaxer, please take 1 tablet twice a day for the next 7 days.  Please be aware this medication can make you drowsy do not drink alcohol or drive while taking this medication.  The second medication is tramadol, you may take this medication every 6 hours for pain control up to 3 days. You may also apply ice or heat to injuries to help with symptomatic treatment.
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
## 2022-01-21 ENCOUNTER — Encounter (HOSPITAL_COMMUNITY): Payer: Self-pay

## 2022-01-21 ENCOUNTER — Emergency Department (HOSPITAL_COMMUNITY): Payer: Medicare HMO

## 2022-01-21 ENCOUNTER — Emergency Department (HOSPITAL_COMMUNITY)
Admission: EM | Admit: 2022-01-21 | Discharge: 2022-01-22 | Disposition: A | Discharge status: Home / Self Care | Payer: Medicare HMO | Attending: Emergency Medicine | Admitting: Emergency Medicine

## 2022-01-21 ENCOUNTER — Other Ambulatory Visit: Payer: Self-pay

## 2022-01-21 DIAGNOSIS — R531 Weakness: Secondary | ICD-10-CM | POA: Diagnosis not present

## 2022-01-21 DIAGNOSIS — Z79899 Other long term (current) drug therapy: Secondary | ICD-10-CM | POA: Insufficient documentation

## 2022-01-21 DIAGNOSIS — Z20822 Contact with and (suspected) exposure to covid-19: Secondary | ICD-10-CM | POA: Insufficient documentation

## 2022-01-21 DIAGNOSIS — R109 Unspecified abdominal pain: Secondary | ICD-10-CM | POA: Insufficient documentation

## 2022-01-21 DIAGNOSIS — Y92410 Unspecified street and highway as the place of occurrence of the external cause: Secondary | ICD-10-CM | POA: Diagnosis not present

## 2022-01-21 DIAGNOSIS — R079 Chest pain, unspecified: Secondary | ICD-10-CM | POA: Insufficient documentation

## 2022-01-21 DIAGNOSIS — R4789 Other speech disturbances: Secondary | ICD-10-CM | POA: Insufficient documentation

## 2022-01-21 DIAGNOSIS — R202 Paresthesia of skin: Secondary | ICD-10-CM | POA: Insufficient documentation

## 2022-01-21 DIAGNOSIS — R7989 Other specified abnormal findings of blood chemistry: Secondary | ICD-10-CM | POA: Insufficient documentation

## 2022-01-21 DIAGNOSIS — R200 Anesthesia of skin: Secondary | ICD-10-CM | POA: Diagnosis present

## 2022-01-21 DIAGNOSIS — R519 Headache, unspecified: Secondary | ICD-10-CM | POA: Diagnosis not present

## 2022-01-21 LAB — RESP PANEL BY RT-PCR (FLU A&B, COVID) ARPGX2
Influenza A by PCR: NEGATIVE
Influenza B by PCR: NEGATIVE
SARS Coronavirus 2 by RT PCR: NEGATIVE

## 2022-01-21 LAB — URINALYSIS, ROUTINE W REFLEX MICROSCOPIC
Bilirubin Urine: NEGATIVE
Glucose, UA: NEGATIVE mg/dL
Hgb urine dipstick: NEGATIVE
Ketones, ur: NEGATIVE mg/dL
Leukocytes,Ua: NEGATIVE
Nitrite: NEGATIVE
Protein, ur: NEGATIVE mg/dL
Specific Gravity, Urine: 1.023 (ref 1.005–1.030)
pH: 5 (ref 5.0–8.0)

## 2022-01-21 LAB — DIFFERENTIAL
Abs Immature Granulocytes: 0.01 K/uL (ref 0.00–0.07)
Basophils Absolute: 0 K/uL (ref 0.0–0.1)
Basophils Relative: 0 %
Eosinophils Absolute: 0.1 K/uL (ref 0.0–0.5)
Eosinophils Relative: 2 %
Immature Granulocytes: 0 %
Lymphocytes Relative: 34 %
Lymphs Abs: 1.7 K/uL (ref 0.7–4.0)
Monocytes Absolute: 0.4 K/uL (ref 0.1–1.0)
Monocytes Relative: 8 %
Neutro Abs: 2.7 K/uL (ref 1.7–7.7)
Neutrophils Relative %: 56 %

## 2022-01-21 LAB — COMPREHENSIVE METABOLIC PANEL WITH GFR
ALT: 20 U/L (ref 0–44)
AST: 20 U/L (ref 15–41)
Albumin: 4.1 g/dL (ref 3.5–5.0)
Alkaline Phosphatase: 57 U/L (ref 38–126)
Anion gap: 8 (ref 5–15)
BUN: 12 mg/dL (ref 6–20)
CO2: 24 mmol/L (ref 22–32)
Calcium: 9.5 mg/dL (ref 8.9–10.3)
Chloride: 108 mmol/L (ref 98–111)
Creatinine, Ser: 1.05 mg/dL — ABNORMAL HIGH (ref 0.44–1.00)
GFR, Estimated: >60 mL/min
Glucose, Bld: 116 mg/dL — ABNORMAL HIGH (ref 70–99)
Potassium: 3.4 mmol/L — ABNORMAL LOW (ref 3.5–5.1)
Sodium: 140 mmol/L (ref 135–145)
Total Bilirubin: 0.8 mg/dL (ref 0.3–1.2)
Total Protein: 8.1 g/dL (ref 6.5–8.1)

## 2022-01-21 LAB — CBC
HCT: 41.8 % (ref 36.0–46.0)
Hemoglobin: 14.3 g/dL (ref 12.0–15.0)
MCH: 32.1 pg (ref 26.0–34.0)
MCHC: 34.2 g/dL (ref 30.0–36.0)
MCV: 93.9 fL (ref 80.0–100.0)
Platelets: 243 10*3/uL (ref 150–400)
RBC: 4.45 MIL/uL (ref 3.87–5.11)
RDW: 12.8 % (ref 11.5–15.5)
WBC: 4.9 10*3/uL (ref 4.0–10.5)
nRBC: 0 % (ref 0.0–0.2)

## 2022-01-21 LAB — I-STAT CHEM 8, ED
BUN: 10 mg/dL (ref 6–20)
Calcium, Ion: 1.14 mmol/L — ABNORMAL LOW (ref 1.15–1.40)
Chloride: 105 mmol/L (ref 98–111)
Creatinine, Ser: 0.9 mg/dL (ref 0.44–1.00)
Glucose, Bld: 114 mg/dL — ABNORMAL HIGH (ref 70–99)
HCT: 43 % (ref 36.0–46.0)
Hemoglobin: 14.6 g/dL (ref 12.0–15.0)
Potassium: 3.5 mmol/L (ref 3.5–5.1)
Sodium: 139 mmol/L (ref 135–145)
TCO2: 24 mmol/L (ref 22–32)

## 2022-01-21 LAB — RAPID URINE DRUG SCREEN, HOSP PERFORMED
Amphetamines: NOT DETECTED
Barbiturates: NOT DETECTED
Benzodiazepines: NOT DETECTED
Cocaine: NOT DETECTED
Opiates: NOT DETECTED
Tetrahydrocannabinol: NOT DETECTED

## 2022-01-21 LAB — ETHANOL: Alcohol, Ethyl (B): <10 mg/dL (ref <10)

## 2022-01-21 LAB — I-STAT BETA HCG BLOOD, ED (MC, WL, AP ONLY): I-stat hCG, quantitative: <5 m[IU]/mL (ref <5)

## 2022-01-21 LAB — PROTIME-INR
INR: 1 (ref 0.8–1.2)
Prothrombin Time: 13.1 seconds (ref 11.4–15.2)

## 2022-01-21 LAB — CBG MONITORING, ED: Glucose-Capillary: 104 mg/dL — ABNORMAL HIGH (ref 70–99)

## 2022-01-21 LAB — APTT: aPTT: 32 seconds (ref 24–36)

## 2022-01-21 MED ORDER — DIAZEPAM 5 MG/ML IJ SOLN
2.5000 mg | Freq: Once | INTRAMUSCULAR | Status: AC | PRN
  Administered 2022-01-21: 2.5 mg via INTRAVENOUS
  Filled 2022-01-21: qty 2

## 2022-01-21 MED ORDER — ACETAMINOPHEN 325 MG PO TABS
650.0000 mg | ORAL_TABLET | Freq: Once | ORAL | Status: AC
  Administered 2022-01-21: 650 mg via ORAL
  Filled 2022-01-21: qty 2

## 2022-01-21 MED ORDER — KETOROLAC TROMETHAMINE 15 MG/ML IJ SOLN
15.0000 mg | Freq: Once | INTRAMUSCULAR | Status: AC
  Administered 2022-01-21: 15 mg via INTRAVENOUS
  Filled 2022-01-21: qty 1

## 2022-01-21 MED ORDER — METHOCARBAMOL 500 MG PO TABS: 500.0000 mg | ORAL_TABLET | Freq: Three times a day (TID) | ORAL | 0 refills | Status: DC | PRN

## 2022-01-21 MED ORDER — METHOCARBAMOL 500 MG PO TABS: 750.0000 mg | ORAL_TABLET | Freq: Once | ORAL | Status: DC

## 2022-01-21 MED ORDER — LORAZEPAM 2 MG/ML IJ SOLN
1.0000 mg | Freq: Once | INTRAMUSCULAR | Status: DC
  Filled 2022-01-21: qty 1
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
## 2022-01-22 NOTE — ED Notes (Signed)
Pt. Speech returned. Pt. Dressed herself and left A&Ox4 ambulatory with family member.

## 2022-01-24 ENCOUNTER — Emergency Department (HOSPITAL_BASED_OUTPATIENT_CLINIC_OR_DEPARTMENT_OTHER)
Admission: EM | Admit: 2022-01-24 | Discharge: 2022-01-24 | Discharge status: Absent without leave | Payer: Medicare HMO | Source: Home / Self Care

## 2022-01-24 ENCOUNTER — Other Ambulatory Visit: Payer: Self-pay

## 2022-01-24 ENCOUNTER — Emergency Department (HOSPITAL_BASED_OUTPATIENT_CLINIC_OR_DEPARTMENT_OTHER): Payer: Medicare HMO

## 2022-01-24 ENCOUNTER — Encounter (HOSPITAL_BASED_OUTPATIENT_CLINIC_OR_DEPARTMENT_OTHER): Payer: Self-pay | Admitting: Pediatrics

## 2022-01-24 ENCOUNTER — Emergency Department (HOSPITAL_BASED_OUTPATIENT_CLINIC_OR_DEPARTMENT_OTHER)
Admission: EM | Admit: 2022-01-24 | Discharge: 2022-01-24 | Disposition: A | Discharge status: Home / Self Care | Payer: Medicare HMO | Attending: Emergency Medicine | Admitting: Emergency Medicine

## 2022-01-24 DIAGNOSIS — R0602 Shortness of breath: Secondary | ICD-10-CM | POA: Insufficient documentation

## 2022-01-24 DIAGNOSIS — R0789 Other chest pain: Secondary | ICD-10-CM | POA: Insufficient documentation

## 2022-01-24 DIAGNOSIS — R519 Headache, unspecified: Secondary | ICD-10-CM | POA: Diagnosis not present

## 2022-01-24 DIAGNOSIS — R4789 Other speech disturbances: Secondary | ICD-10-CM | POA: Insufficient documentation

## 2022-01-24 LAB — BASIC METABOLIC PANEL
Anion gap: 7 (ref 5–15)
BUN: 14 mg/dL (ref 6–20)
CO2: 26 mmol/L (ref 22–32)
Calcium: 9.4 mg/dL (ref 8.9–10.3)
Chloride: 105 mmol/L (ref 98–111)
Creatinine, Ser: 0.86 mg/dL (ref 0.44–1.00)
GFR, Estimated: >60 mL/min (ref >60)
Glucose, Bld: 91 mg/dL (ref 70–99)
Potassium: 3.7 mmol/L (ref 3.5–5.1)
Sodium: 138 mmol/L (ref 135–145)

## 2022-01-24 LAB — CBC
HCT: 39.2 % (ref 36.0–46.0)
Hemoglobin: 13.6 g/dL (ref 12.0–15.0)
MCH: 32.2 pg (ref 26.0–34.0)
MCHC: 34.7 g/dL (ref 30.0–36.0)
MCV: 92.7 fL (ref 80.0–100.0)
Platelets: 240 10*3/uL (ref 150–400)
RBC: 4.23 MIL/uL (ref 3.87–5.11)
RDW: 12.8 % (ref 11.5–15.5)
WBC: 3.8 10*3/uL — ABNORMAL LOW (ref 4.0–10.5)
nRBC: 0 % (ref 0.0–0.2)

## 2022-01-24 LAB — TROPONIN I (HIGH SENSITIVITY): Troponin I (High Sensitivity): 2 ng/L (ref <18)

## 2022-01-24 LAB — D-DIMER, QUANTITATIVE: D-Dimer, Quant: 0.41 ug/mL-FEU (ref 0.00–0.50)

## 2022-01-24 MED ORDER — ACETAMINOPHEN 325 MG PO TABS
650.0000 mg | ORAL_TABLET | Freq: Once | ORAL | Status: AC
  Administered 2022-01-24: 650 mg via ORAL
  Filled 2022-01-24: qty 2
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
## 2022-01-24 NOTE — ED Notes (Signed)
Patient transported to CT 

## 2022-01-24 NOTE — ED Triage Notes (Signed)
C/O shortness of breathe for a few days along w/ heaviness on chest and burning in throat.

## 2022-01-24 NOTE — ED Notes (Signed)
Patient stated that her face is itching / RN notified

## 2022-01-24 NOTE — ED Notes (Signed)
Patient in bed, VSS; inspected facial area d/t complaints of "itching"; no obvious rashes or any overt discoloration noted. MD aware.

## 2022-01-24 NOTE — ED Notes (Signed)
Late note: Patient seen prior to registration/triage, SpO2 100%, HR 98, RR high 20's.  BBS very decreased t/o, speaking in 2-3 word statements.
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
## 2022-02-06 ENCOUNTER — Ambulatory Visit (INDEPENDENT_AMBULATORY_CARE_PROVIDER_SITE_OTHER): Payer: Medicare HMO | Admitting: Family Medicine

## 2022-02-06 VITALS — BP 130/82 | HR 102 | Ht 65.0 in | Wt 219.6 lb

## 2022-02-06 DIAGNOSIS — G4489 Other headache syndrome: Secondary | ICD-10-CM

## 2022-02-06 DIAGNOSIS — G4701 Insomnia due to medical condition: Secondary | ICD-10-CM

## 2022-02-06 DIAGNOSIS — Z7409 Other reduced mobility: Secondary | ICD-10-CM | POA: Diagnosis not present

## 2022-02-06 DIAGNOSIS — S060X9A Concussion with loss of consciousness of unspecified duration, initial encounter: Secondary | ICD-10-CM

## 2022-02-06 MED ORDER — NORTRIPTYLINE HCL 25 MG PO CAPS: 25.0000 mg | ORAL_CAPSULE | Freq: Every day | ORAL | 1 refills | Status: DC
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
## 2022-02-06 NOTE — Patient Instructions (Addendum)
Thank you for coming in today.   I've sent a prescription for nortriptyline to your pharmacy.   I've referred you to Vestibular Therapy.  Let us know if you don't hear from them in one week.   Check back in 2 weeks

## 2022-02-20 ENCOUNTER — Ambulatory Visit (INDEPENDENT_AMBULATORY_CARE_PROVIDER_SITE_OTHER): Payer: Medicare HMO | Admitting: Family Medicine

## 2022-02-20 ENCOUNTER — Encounter: Payer: Self-pay | Admitting: Family Medicine

## 2022-02-20 VITALS — BP 104/80 | HR 85 | Ht 65.0 in | Wt 219.8 lb

## 2022-02-20 DIAGNOSIS — S060X9D Concussion with loss of consciousness of unspecified duration, subsequent encounter: Secondary | ICD-10-CM

## 2022-02-20 DIAGNOSIS — G43711 Chronic migraine without aura, intractable, with status migrainosus: Secondary | ICD-10-CM | POA: Diagnosis not present

## 2022-02-20 DIAGNOSIS — G4701 Insomnia due to medical condition: Secondary | ICD-10-CM

## 2022-02-20 DIAGNOSIS — Z7409 Other reduced mobility: Secondary | ICD-10-CM | POA: Diagnosis not present

## 2022-02-20 MED ORDER — EMGALITY 120 MG/ML ~~LOC~~ SOAJ
240.0000 mg | Freq: Once | SUBCUTANEOUS | 1 refills | Status: AC
Start: 2022-02-20 — End: 2022-02-20

## 2022-02-20 MED ORDER — EMGALITY 120 MG/ML ~~LOC~~ SOAJ: 120.0000 mg | SUBCUTANEOUS | 12 refills | Status: DC

## 2022-02-20 NOTE — Patient Instructions (Addendum)
Good to see you today.  We will start Emgality.  Let me know if you have trouble getting this medicine.  I expect some sort of trouble.  Call your insurance company and ask why and get details as well.   Follow-up: 2 weeks.  (keep at 30 min)
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
## 2022-02-25 ENCOUNTER — Ambulatory Visit: Payer: Medicare HMO
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
## 2022-03-06 ENCOUNTER — Ambulatory Visit (INDEPENDENT_AMBULATORY_CARE_PROVIDER_SITE_OTHER): Payer: Medicare HMO | Admitting: Family Medicine

## 2022-03-06 VITALS — BP 112/70 | HR 97 | Ht 65.0 in | Wt 222.4 lb

## 2022-03-06 DIAGNOSIS — L299 Pruritus, unspecified: Secondary | ICD-10-CM

## 2022-03-06 DIAGNOSIS — G4701 Insomnia due to medical condition: Secondary | ICD-10-CM | POA: Diagnosis not present

## 2022-03-06 DIAGNOSIS — Z7409 Other reduced mobility: Secondary | ICD-10-CM | POA: Diagnosis not present

## 2022-03-06 DIAGNOSIS — S060X9D Concussion with loss of consciousness of unspecified duration, subsequent encounter: Secondary | ICD-10-CM | POA: Diagnosis not present

## 2022-03-06 DIAGNOSIS — G43711 Chronic migraine without aura, intractable, with status migrainosus: Secondary | ICD-10-CM

## 2022-03-06 MED ORDER — PREDNISONE 5 MG (48) PO TBPK: ORAL_TABLET | ORAL | 0 refills | Status: DC

## 2022-03-06 MED ORDER — TRAZODONE HCL 50 MG PO TABS: 50.0000 mg | ORAL_TABLET | Freq: Every day | ORAL | 1 refills | Status: DC

## 2022-03-06 NOTE — Patient Instructions (Addendum)
Thank you for coming in today.   Take Certizine  (Zyrtec) for itching if needed.  OK to use benadryl as well   Take that prednisone if you get worse. Let me know if you do.   Use trazodone as needed for sleep.   Recheck in 1 month.   Attend Vestibular PT.
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
## 2022-03-07 ENCOUNTER — Ambulatory Visit: Payer: Medicare HMO | Attending: Family Medicine

## 2022-03-07 DIAGNOSIS — M6281 Muscle weakness (generalized): Secondary | ICD-10-CM | POA: Diagnosis present

## 2022-03-07 DIAGNOSIS — S060X9A Concussion with loss of consciousness of unspecified duration, initial encounter: Secondary | ICD-10-CM | POA: Diagnosis not present

## 2022-03-07 DIAGNOSIS — M25512 Pain in left shoulder: Secondary | ICD-10-CM | POA: Diagnosis present

## 2022-03-07 DIAGNOSIS — M25552 Pain in left hip: Secondary | ICD-10-CM | POA: Insufficient documentation

## 2022-03-07 DIAGNOSIS — M5459 Other low back pain: Secondary | ICD-10-CM | POA: Insufficient documentation

## 2022-03-07 DIAGNOSIS — R42 Dizziness and giddiness: Secondary | ICD-10-CM | POA: Insufficient documentation

## 2022-03-07 DIAGNOSIS — G4486 Cervicogenic headache: Secondary | ICD-10-CM | POA: Insufficient documentation

## 2022-03-07 DIAGNOSIS — R2689 Other abnormalities of gait and mobility: Secondary | ICD-10-CM | POA: Diagnosis present

## 2022-03-07 DIAGNOSIS — M542 Cervicalgia: Secondary | ICD-10-CM | POA: Insufficient documentation

## 2022-03-13 ENCOUNTER — Ambulatory Visit: Payer: Medicare HMO | Admitting: Physical Therapy

## 2022-03-13 ENCOUNTER — Encounter: Payer: Self-pay | Admitting: Physical Therapy

## 2022-03-13 DIAGNOSIS — M542 Cervicalgia: Secondary | ICD-10-CM

## 2022-03-13 DIAGNOSIS — R2689 Other abnormalities of gait and mobility: Secondary | ICD-10-CM

## 2022-03-13 DIAGNOSIS — M5459 Other low back pain: Secondary | ICD-10-CM

## 2022-03-13 DIAGNOSIS — M25552 Pain in left hip: Secondary | ICD-10-CM

## 2022-03-13 DIAGNOSIS — G4486 Cervicogenic headache: Secondary | ICD-10-CM

## 2022-03-13 DIAGNOSIS — M25512 Pain in left shoulder: Secondary | ICD-10-CM

## 2022-03-13 DIAGNOSIS — M6281 Muscle weakness (generalized): Secondary | ICD-10-CM

## 2022-03-13 DIAGNOSIS — R42 Dizziness and giddiness: Secondary | ICD-10-CM

## 2022-03-13 NOTE — Therapy (Signed)
OUTPATIENT PHYSICAL THERAPY NEURO EVALUATION   Patient Name: Laura Medina MRN: [id number] DOB:1966/01/06, 56 y.o., female Today's Date: 03/13/2022   PCP: Rocky Link REFERRING PROVIDER: Earma Reading   PT End of Session - 03/13/22 1329     Visit Number 2    Date for PT Re-Evaluation 05/30/22    Authorization Type [employer]    PT Start Time 1316    PT Stop Time 1356    PT Time Calculation (min) 40 min    Activity Tolerance Patient tolerated treatment well    Behavior During Therapy [facility] for tasks assessed/performed             Past Medical History:  Diagnosis Date   Arthritis    Fibromyalgia    Past Surgical History:  Procedure Laterality Date   blood clot evacuation     left leg   CHOLECYSTECTOMY     EXPLORATORY LAPAROTOMY     HEMORRHOID SURGERY     Patient Active Problem List   Diagnosis Date Noted   Rheumatoid arthritis of multiple sites without rheumatoid factor (HCC) 07/01/2016   High risk medication use 07/01/2016   Osteoarthritis, 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   Osteoarthritis, knee 07/01/2016   DDD (degenerative disc disease), lumbar 07/01/2016   Fibromyalgia 02/16/2013   Functional gait disorder 02/16/2013   Disturbance of skin sensation 02/16/2013   Pain in limb 02/16/2013    ONSET DATE: 01/15/22  REFERRING DIAG: [email or web address]  THERAPY DIAG:  Cervicalgia  Dizziness and giddiness  Muscle weakness (generalized)  Cervicogenic headache  Pain in left hip  Other low back pain  Acute pain of left shoulder  Other abnormalities of gait and mobility  Rationale for Evaluation and Treatment Rehabilitation  SUBJECTIVE:                                                                                                                                                                                              SUBJECTIVE STATEMENT: Patient reports that her dizziness is unchanged. She went to a Chiropractor who diagnosed her with L shoulder bursitis and put her in a  sling. She has an appointment with an ortho Dr on [address].  PERTINENT HISTORY: Fibromyalgia, arthritis   FALLS: Has patient fallen in last 6 months? No  LIVING ENVIRONMENT: Lives with: lives with their family Lives in: House/apartment Stairs: Yes: External: 15 steps; can reach both Has following equipment at home: None  PLOF: Independent  OCCUPATION: customer service at [employer]   PATIENT GOALS to get better and get back to work   OBJECTIVE:   DIAGNOSTIC FINDINGS: IMPRESSION: 1. No acute intracranial abnormalities. No change from the  recent studies.     IMPRESSION: No evidence of acute traumatic injury to the cervical spine.   Cervical spondylosis, as outlined. At C4-C5, a shallow disc bulge mildly effaces the ventral thecal sac and may contact the ventral spinal cord. No significant spinal canal stenosis at the remaining levels. No significant foraminal stenosis. Mild multilevel disc degeneration. Minimal degenerative endplate edema at G4-W1  COGNITION: Overall cognitive status: Within functional limits for tasks assessed   SENSATION: WFL  POSTURE: rounded shoulders, forward head, anterior pelvic tilt, and flexed trunk   LOWER EXTREMITY ROM:   WFL  LOWER EXTREMITY MMT:    MMT Right Eval Left Eval  Hip flexion 5 3+ w/pain  Hip extension    Hip abduction    Hip adduction    Hip internal rotation  4- w/pain  Hip external rotation  4- w/pain  Knee flexion 5 4- w/pain  Knee extension 5 4  Ankle dorsiflexion    Ankle plantarflexion    Ankle inversion    Ankle eversion    (Blank rows = not tested)  CERVICAL ROM:   Active ROM A/PROM (deg) eval  Flexion Pain at end range  Extension Mildly limitied with some pain   Right lateral flexion Mod tightness  Left lateral flexion Mod tightness w/pain  Right rotation 80d w/pain  Left rotation 80d    (Blank rows = not tested)  UPPER EXTREMITY ROM:  Active ROM Right eval Left eval  Shoulder flexion WNL 110  w/pain  Shoulder extension    Shoulder abduction WNL 94 w/pain  Shoulder adduction    Shoulder extension    Shoulder internal rotation [facility] WFL  Shoulder external rotation [facility] WFL  Elbow flexion WNL WNL  Elbow extension WNL WNL  Wrist flexion    Wrist extension    Wrist ulnar deviation    Wrist radial deviation    Wrist pronation    Wrist supination     (Blank rows = not tested)  UPPER EXTREMITY MMT:  MMT Right eval Left eval  Shoulder flexion 3+ w/pain 2+ w/pain  Shoulder extension    Shoulder abduction 3+ w/pain 2+ w/pain  Shoulder adduction    Shoulder extension    Shoulder internal rotation 4+ 3+w/pain  Shoulder external rotation 4+ 3+ w/pain  Middle trapezius    Lower trapezius    Elbow flexion 5 4+ w/pain  Elbow extension 5 4+ w/pain  Wrist flexion    Wrist extension    Wrist ulnar deviation    Wrist radial deviation    Wrist pronation    Wrist supination    Grip strength     (Blank rows = not tested)   Concussion Self-Reported Symptom Score Symptoms rated on a scale 1-6, in last 24 hours    Headache: 6   Nausea: 0  Dizziness: 4  Vomiting: 0  Balance Difficulty: 5   Trouble Falling Asleep: 6   Fatigue: 6  Sleep Less Than Usual: 6  Daytime Drowsiness: 4  Sleep More Than Usual: 0  Photophobia: 5  Phonophobia: 5  Irritability: 4  Sadness: 0  Numbness or Tingling: 6  Nervousness: 4  Feeling More Emotional: 4  Feeling Mentally Foggy: 5  Feeling Slowed Down: 6  Memory Problems: 5  Difficulty Concentrating: 5  Visual Problems: 5   Total # of Symptoms: 18/22 Total Symptom Score: 91/132  GAIT: Gait pattern: step to pattern, decreased step length- Right, decreased step length- Left, decreased stance time- Left, decreased stride length, antalgic, trendelenburg, wide  BOS, poor foot clearance- Right, and poor foot clearance- Left Distance walked: 74ft Assistive device utilized: None Level of assistance: Complete Independence Comments: antalgic  gait, slowed speed  FUNCTIONAL TESTs:  5 times sit to stand: 25.85s w/slight dizziness Timed up and go (TUG): 18.59s Berg Balance Scale: TBD  VESTIBULAR ASSESSMENT Horizontal and vertical saccades: cause dizziness VOR cancellation: x1 unable to maintain gaze after a few reps: dizziness. x2 double vision after 2 reps and dizziness  DVA: positive- 20/30 and 20/50 w/head turns Convergence: > 12in   PATIENT SURVEYS:  FOTO 43/100  TODAY'S TREATMENT:  03/13/22 Horizontal Saccades x 10- dizziness to 4/10, Increased difficulty with her eyes getting "stuck" and unable to shift back toward the end. Dizziness did not recede after 3 minutes, so further saccades deferred. Supine on mat, STM-Very gentle, to cerv paraspinals, up traps, LS, pects B, R tighter than L. Initiated gentle PROM to neck and upper trunk. Patient with limited tolerance, Increased pain, so moved to AA shoulder shrugs and depression, and scapular abd/add MH and IFC e stim to cervical and upper trunk, 12 minutes, to tolerance.  Horizontal and vertical saccades Convergence w/letter    PATIENT EDUCATION: Education details: POC Person educated: Patient Education method: Explanation Education comprehension: verbalized understanding   HOME EXERCISE PROGRAM: Access Code: [email or web address]   GOALS: Goals reviewed with patient? Yes  SHORT TERM GOALS: Target date: 04/18/22  Patient will be independent with initial HEP.  Goal status: ongoing  2.   Patient will report 50% improvement in concussion and dizziness symptoms.  Goal status: INITIAL   LONG TERM GOALS: Target date: 05/30/22  Patient will be independent with advanced/ongoing HEP to improve outcomes and carryover.  Goal status: INITIAL  2.  Patient will report improvement in concussion and dizziness symptoms w/ <65 on concussion symptom score.  Baseline: 91 Goal status: INITIAL  3.  Patient will demonstrate improved functional LE and UE strength as demonstrated by  increase in 1 muscle grade. Goal status: INITIAL  4.  Patient to improve L shoulder and cervical AROM to [facility] without pain provocation to allow for increased ease of ADLs.  Goal status: INITIAL  5.  Patient will report 35 on FOTO to demonstrate improved functional ability.  Baseline: 43 Goal status: INITIAL  7.   Patient will demonstrate decreased fall risk by scoring < 15 sec on TUG and 5xSTS. Baseline: 18.59s, 25.85s Goal status: INITIAL   ASSESSMENT:  CLINICAL IMPRESSION: Patient reports continued severe symptoms including easily triggered dizziness, spasm and pain in neck and upper trunk, headaches. She has performed HEP as tolerated, but it triggers the dizziness quickly. Treatment attempted to address her visual impairments, but dizziness exacerbated and then remained elevated, so deferred further treatment and moved to her muscle spasms and pain in neck and upper trunk, trying to mobilize same. Educated to attempt very gentle cervical rotation and lateral flex in supine to mobilize her neck. If it causes dizziness, try to mobilize shoulders. Finished with MH and e stim to further address acute pain and spasm. Her dizziness exacerbated when she moved sup to sit, up to level 4, with difficulty walking.  OBJECTIVE IMPAIRMENTS Abnormal gait, decreased activity tolerance, decreased balance, difficulty walking, decreased ROM, decreased strength, dizziness, impaired UE functional use, improper body mechanics, postural dysfunction, and pain.   ACTIVITY LIMITATIONS carrying, lifting, bending, squatting, sleeping, and locomotion level  PARTICIPATION LIMITATIONS: cleaning, laundry, shopping, community activity, and occupation  REHAB POTENTIAL: Good  CLINICAL DECISION MAKING: Evolving/moderate complexity  EVALUATION  COMPLEXITY: Moderate  PLAN: PT FREQUENCY: 1-2x/week  PT DURATION: 12 weeks  PLANNED INTERVENTIONS: Therapeutic exercises, Therapeutic activity, Neuromuscular re-education,  Balance training, Gait training, Patient/Family education, Self Care, Joint mobilization, Stair training, Vestibular training, Canalith repositioning, Dry Needling, Electrical stimulation, Cryotherapy, Moist heat, Vasopneumatic device, Traction, Ionotophoresis 4mg /ml Dexamethasone, and Manual therapy  PLAN FOR NEXT SESSION:  BERG, how did Ortho appointment go, any updates?   , DPT 03/13/2022, 3:27 PM

## 2022-03-24 ENCOUNTER — Ambulatory Visit: Payer: Medicare HMO | Admitting: Physical Therapy

## 2022-03-24 ENCOUNTER — Encounter: Payer: Self-pay | Admitting: Physical Therapy

## 2022-03-24 DIAGNOSIS — M6281 Muscle weakness (generalized): Secondary | ICD-10-CM

## 2022-03-24 DIAGNOSIS — M5459 Other low back pain: Secondary | ICD-10-CM

## 2022-03-24 DIAGNOSIS — G4486 Cervicogenic headache: Secondary | ICD-10-CM

## 2022-03-24 DIAGNOSIS — M25552 Pain in left hip: Secondary | ICD-10-CM

## 2022-03-24 DIAGNOSIS — M25512 Pain in left shoulder: Secondary | ICD-10-CM

## 2022-03-24 DIAGNOSIS — R42 Dizziness and giddiness: Secondary | ICD-10-CM

## 2022-03-24 DIAGNOSIS — M542 Cervicalgia: Secondary | ICD-10-CM

## 2022-03-24 DIAGNOSIS — R2689 Other abnormalities of gait and mobility: Secondary | ICD-10-CM

## 2022-03-24 NOTE — Therapy (Signed)
OUTPATIENT PHYSICAL THERAPY NEURO EVALUATION   Patient Name: Laura Medina MRN: [id number] DOB:03-May-1966, 56 y.o., female Today's Date: 03/24/2022   PCP: Rocky Link REFERRING PROVIDER: Earma Reading   PT End of Session - 03/24/22 1543     Visit Number 3    Date for PT Re-Evaluation 05/30/22    Authorization Type [employer]    PT Start Time 1503    PT Stop Time 1542    PT Time Calculation (min) 39 min    Activity Tolerance Patient tolerated treatment well    Behavior During Therapy [facility] for tasks assessed/performed              Past Medical History:  Diagnosis Date   Arthritis    Fibromyalgia    Past Surgical History:  Procedure Laterality Date   blood clot evacuation     left leg   CHOLECYSTECTOMY     EXPLORATORY LAPAROTOMY     HEMORRHOID SURGERY     Patient Active Problem List   Diagnosis Date Noted   Rheumatoid arthritis of multiple sites without rheumatoid factor (HCC) 07/01/2016   High risk medication use 07/01/2016   Osteoarthritis, 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   Osteoarthritis, knee 07/01/2016   DDD (degenerative disc disease), lumbar 07/01/2016   Fibromyalgia 02/16/2013   Functional gait disorder 02/16/2013   Disturbance of skin sensation 02/16/2013   Pain in limb 02/16/2013    ONSET DATE: 01/15/22  REFERRING DIAG: [email or web address]  THERAPY DIAG:  Cervicalgia  Dizziness and giddiness  Muscle weakness (generalized)  Cervicogenic headache  Other abnormalities of gait and mobility  Acute pain of left shoulder  Other low back pain  Pain in left hip  Rationale for Evaluation and Treatment Rehabilitation  SUBJECTIVE:                                                                                                                                                                                              SUBJECTIVE STATEMENT: Patient reports that her dizziness is unchanged. She went to a Chiropractor who diagnosed her with L shoulder bursitis and put her in  a sling. She has an appointment with an ortho Dr on [address].  PERTINENT HISTORY: Fibromyalgia, arthritis   FALLS: Has patient fallen in last 6 months? No  LIVING ENVIRONMENT: Lives with: lives with their family Lives in: House/apartment Stairs: Yes: External: 15 steps; can reach both Has following equipment at home: None  PLOF: Independent  OCCUPATION: customer service at [employer]   PATIENT GOALS to get better and get back to work   OBJECTIVE:   DIAGNOSTIC FINDINGS: IMPRESSION: 1. No acute intracranial abnormalities. No change from  the recent studies.     IMPRESSION: No evidence of acute traumatic injury to the cervical spine.   Cervical spondylosis, as outlined. At C4-C5, a shallow disc bulge mildly effaces the ventral thecal sac and may contact the ventral spinal cord. No significant spinal canal stenosis at the remaining levels. No significant foraminal stenosis. Mild multilevel disc degeneration. Minimal degenerative endplate edema at D3-O6  COGNITION: Overall cognitive status: Within functional limits for tasks assessed   SENSATION: WFL  POSTURE: rounded shoulders, forward head, anterior pelvic tilt, and flexed trunk   LOWER EXTREMITY ROM:   WFL  LOWER EXTREMITY MMT:    MMT Right Eval Left Eval  Hip flexion 5 3+ w/pain  Hip extension    Hip abduction    Hip adduction    Hip internal rotation  4- w/pain  Hip external rotation  4- w/pain  Knee flexion 5 4- w/pain  Knee extension 5 4  Ankle dorsiflexion    Ankle plantarflexion    Ankle inversion    Ankle eversion    (Blank rows = not tested)  CERVICAL ROM:   Active ROM A/PROM (deg) eval  Flexion Pain at end range  Extension Mildly limitied with some pain   Right lateral flexion Mod tightness  Left lateral flexion Mod tightness w/pain  Right rotation 80d w/pain  Left rotation 80d    (Blank rows = not tested)  UPPER EXTREMITY ROM:  Active ROM Right eval Left eval  Shoulder flexion WNL 110  w/pain  Shoulder extension    Shoulder abduction WNL 94 w/pain  Shoulder adduction    Shoulder extension    Shoulder internal rotation [facility] WFL  Shoulder external rotation [facility] WFL  Elbow flexion WNL WNL  Elbow extension WNL WNL  Wrist flexion    Wrist extension    Wrist ulnar deviation    Wrist radial deviation    Wrist pronation    Wrist supination     (Blank rows = not tested)  UPPER EXTREMITY MMT:  MMT Right eval Left eval  Shoulder flexion 3+ w/pain 2+ w/pain  Shoulder extension    Shoulder abduction 3+ w/pain 2+ w/pain  Shoulder adduction    Shoulder extension    Shoulder internal rotation 4+ 3+w/pain  Shoulder external rotation 4+ 3+ w/pain  Middle trapezius    Lower trapezius    Elbow flexion 5 4+ w/pain  Elbow extension 5 4+ w/pain  Wrist flexion    Wrist extension    Wrist ulnar deviation    Wrist radial deviation    Wrist pronation    Wrist supination    Grip strength     (Blank rows = not tested)   Concussion Self-Reported Symptom Score Symptoms rated on a scale 1-6, in last 24 hours    Headache: 6   Nausea: 0  Dizziness: 4  Vomiting: 0  Balance Difficulty: 5   Trouble Falling Asleep: 6   Fatigue: 6  Sleep Less Than Usual: 6  Daytime Drowsiness: 4  Sleep More Than Usual: 0  Photophobia: 5  Phonophobia: 5  Irritability: 4  Sadness: 0  Numbness or Tingling: 6  Nervousness: 4  Feeling More Emotional: 4  Feeling Mentally Foggy: 5  Feeling Slowed Down: 6  Memory Problems: 5  Difficulty Concentrating: 5  Visual Problems: 5   Total # of Symptoms: 18/22 Total Symptom Score: 91/132  GAIT: Gait pattern: step to pattern, decreased step length- Right, decreased step length- Left, decreased stance time- Left, decreased stride length, antalgic, trendelenburg,  wide BOS, poor foot clearance- Right, and poor foot clearance- Left Distance walked: 79ft Assistive device utilized: None Level of assistance: Complete Independence Comments: antalgic  gait, slowed speed  FUNCTIONAL TESTs:  5 times sit to stand: 25.85s w/slight dizziness Timed up and go (TUG): 18.59s Berg Balance Scale: TBD  VESTIBULAR ASSESSMENT Horizontal and vertical saccades: cause dizziness VOR cancellation: x1 unable to maintain gaze after a few reps: dizziness. x2 double vision after 2 reps and dizziness  DVA: positive- 20/30 and 20/50 w/head turns Convergence: > 12in   PATIENT SURVEYS:  FOTO 43/100  TODAY'S TREATMENT:  03/24/22 Saccades-horizontal, completed 6 reps before getting stuck, but no increase in dizziness. Round 2, 2 x before getting stuck Vert 4 reps, Round 2, 3 sets. Convergence x 10 reps, tolerated well, no increased dizziness. Standing on Airex pad, normal BOS x 30 sec. Dizziness up to 7/10, resolved in 1 minute. Round 2, dizziness to 5. Round 3, with  narrow BOS, 6. STM to upper cervical paraspinals, Up traps, LS along with shoulder circles, 5 x in each direction.  03/13/22 Horizontal Saccades x 10- dizziness to 4/10, Increased difficulty with her eyes getting "stuck" and unable to shift back toward the end. Dizziness did not recede after 3 minutes, so further saccades deferred. Supine on mat, STM-Very gentle, to cerv paraspinals, up traps, LS, pects B, R tighter than L. Initiated gentle PROM to neck and upper trunk. Patient with limited tolerance, Increased pain, so moved to AA shoulder shrugs and depression, and scapular abd/add MH and IFC e stim to cervical and upper trunk, 12 minutes, to tolerance.  Horizontal and vertical saccades Convergence w/letter    PATIENT EDUCATION: Education details: POC Person educated: Patient Education method: Explanation Education comprehension: verbalized understanding   HOME EXERCISE PROGRAM: Access Code: [email or web address]   GOALS: Goals reviewed with patient? Yes  SHORT TERM GOALS: Target date: 04/18/22  Patient will be independent with initial HEP.  Goal status: ongoing  2.   Patient will  report 50% improvement in concussion and dizziness symptoms.  Goal status: INITIAL   LONG TERM GOALS: Target date: 05/30/22  Patient will be independent with advanced/ongoing HEP to improve outcomes and carryover.  Goal status: INITIAL  2.  Patient will report improvement in concussion and dizziness symptoms w/ <65 on concussion symptom score.  Baseline: 91 Goal status: INITIAL  3.  Patient will demonstrate improved functional LE and UE strength as demonstrated by increase in 1 muscle grade. Goal status: INITIAL  4.  Patient to improve L shoulder and cervical AROM to [facility] without pain provocation to allow for increased ease of ADLs.  Goal status: INITIAL  5.  Patient will report 28 on FOTO to demonstrate improved functional ability.  Baseline: 43 Goal status: INITIAL  7.   Patient will demonstrate decreased fall risk by scoring < 15 sec on TUG and 5xSTS. Baseline: 18.59s, 25.85s Goal status: INITIAL   ASSESSMENT:  CLINICAL IMPRESSION: Patient reports continued severe symptoms, but slowly improving. Arm sling D/C'd. She still has trouble with her eyes getting stuck while performing saccades, and dizziness with VOR, but the dizziness did resolve today. Continued STM and stretch to neck and shoulders, with shoulder circles to start to mobilize upper trunk.  OBJECTIVE IMPAIRMENTS Abnormal gait, decreased activity tolerance, decreased balance, difficulty walking, decreased ROM, decreased strength, dizziness, impaired UE functional use, improper body mechanics, postural dysfunction, and pain.   ACTIVITY LIMITATIONS carrying, lifting, bending, squatting, sleeping, and locomotion level  PARTICIPATION LIMITATIONS: cleaning, laundry, shopping,  community activity, and occupation  REHAB POTENTIAL: Good  CLINICAL DECISION MAKING: Evolving/moderate complexity  EVALUATION COMPLEXITY: Moderate  PLAN: PT FREQUENCY: 1-2x/week  PT DURATION: 12 weeks  PLANNED INTERVENTIONS: Therapeutic  exercises, Therapeutic activity, Neuromuscular re-education, Balance training, Gait training, Patient/Family education, Self Care, Joint mobilization, Stair training, Vestibular training, Canalith repositioning, Dry Needling, Electrical stimulation, Cryotherapy, Moist heat, Vasopneumatic device, Traction, Ionotophoresis 4mg /ml Dexamethasone, and Manual therapy  PLAN FOR NEXT SESSION:  BERG, how did Ortho appointment go, any updates?   , DPT 03/24/2022, 3:46 PM

## 2022-03-25 NOTE — Therapy (Signed)
OUTPATIENT PHYSICAL THERAPY NEURO TREATMENT   Patient Name: Janaya Triola MRN: [id number] DOB:1966-04-30, 56 y.o., female Today's Date: 03/26/2022   PCP: Gracy Bruins REFERRING PROVIDER: Sherene Sires   PT End of Session - 03/26/22 1106     Visit Number 4    Date for PT Re-Evaluation 05/30/22    Authorization Type [employer]    PT Start Time 1106    PT Stop Time 1148    PT Time Calculation (min) 42 min    Activity Tolerance Patient tolerated treatment well    Behavior During Therapy [facility] for tasks assessed/performed               Past Medical History:  Diagnosis Date   Arthritis    Fibromyalgia    Past Surgical History:  Procedure Laterality Date   blood clot evacuation     left leg   CHOLECYSTECTOMY     EXPLORATORY LAPAROTOMY     HEMORRHOID SURGERY     Patient Active Problem List   Diagnosis Date Noted   Rheumatoid arthritis of multiple sites without rheumatoid factor ([place]) 07/01/2016   High risk medication use 07/01/2016   Osteoarthritis, 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   Osteoarthritis, knee 07/01/2016   DDD (degenerative disc disease), lumbar 07/01/2016   Fibromyalgia 02/16/2013   Functional gait disorder 02/16/2013   Disturbance of skin sensation 02/16/2013   Pain in limb 02/16/2013    ONSET DATE: 01/15/22  REFERRING DIAG: [id number]  THERAPY DIAG:  Cervicalgia  Dizziness and giddiness  Muscle weakness (generalized)  Cervicogenic headache  Other abnormalities of gait and mobility  Acute pain of left shoulder  Other low back pain  Pain in left hip  Rationale for Evaluation and Treatment Rehabilitation  SUBJECTIVE:                                                                                                                                                                                              SUBJECTIVE STATEMENT: Having some pain today in my arm, my back, and side. Some days are good and some are bad for dizziness.   PERTINENT HISTORY:  Fibromyalgia, arthritis   FALLS: Has patient fallen in last 6 months? 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 No acute intracranial abnormalities. No change from the recent studies.     IMPRESSION: No evidence of acute  traumatic injury to the cervical spine.   Cervical spondylosis, as outlined. At C4-C5, a shallow disc bulge mildly effaces the ventral thecal sac and may contact the ventral spinal cord. No significant spinal canal stenosis at the remaining levels. No significant foraminal stenosis. Mild multilevel disc degeneration. Minimal degenerative endplate edema at C1-E7  COGNITION: Overall cognitive status: Within functional limits for tasks assessed   SENSATION: WFL  POSTURE: rounded shoulders, forward head, anterior pelvic tilt, and flexed trunk   LOWER EXTREMITY ROM:   WFL  LOWER EXTREMITY MMT:    MMT Right Eval Left Eval  Hip flexion 5 3+ w/pain  Hip extension    Hip abduction    Hip adduction    Hip internal rotation  4- w/pain  Hip external rotation  4- w/pain  Knee flexion 5 4- w/pain  Knee extension 5 4  Ankle dorsiflexion    Ankle plantarflexion    Ankle inversion    Ankle eversion    (Blank rows = not tested)  CERVICAL ROM:   Active ROM A/PROM (deg) eval  Flexion Pain at end range  Extension Mildly limitied with some pain   Right lateral flexion Mod tightness  Left lateral flexion Mod tightness w/pain  Right rotation 80d w/pain  Left rotation 80d    (Blank rows = not tested)  UPPER EXTREMITY ROM:  Active ROM Right eval Left eval  Shoulder flexion WNL 110 w/pain  Shoulder extension    Shoulder abduction WNL 94 w/pain  Shoulder  adduction    Shoulder extension    Shoulder internal rotation [facility] WFL  Shoulder external rotation [facility] WFL  Elbow flexion WNL WNL  Elbow extension WNL WNL  Wrist flexion    Wrist extension    Wrist ulnar deviation    Wrist radial deviation    Wrist pronation    Wrist supination     (Blank rows = not tested)  UPPER EXTREMITY MMT:  MMT Right eval Left eval  Shoulder flexion 3+ w/pain 2+ w/pain  Shoulder extension    Shoulder abduction 3+ w/pain 2+ w/pain  Shoulder adduction    Shoulder extension    Shoulder internal rotation 4+ 3+w/pain  Shoulder external rotation 4+ 3+ w/pain  Middle trapezius    Lower trapezius    Elbow flexion 5 4+ w/pain  Elbow extension 5 4+ w/pain  Wrist flexion    Wrist extension    Wrist ulnar deviation    Wrist radial deviation    Wrist pronation    Wrist supination    Grip strength     (Blank rows = not tested)   Concussion Self-Reported Symptom Score Symptoms rated on a scale 1-6, in last 24 hours    Headache: 6   Nausea: 0  Dizziness: 4  Vomiting: 0  Balance Difficulty: 5   Trouble Falling Asleep: 6   Fatigue: 6  Sleep Less Than Usual: 6  Daytime Drowsiness: 4  Sleep More Than Usual: 0  Photophobia: 5  Phonophobia: 5  Irritability: 4  Sadness: 0  Numbness or Tingling: 6  Nervousness: 4  Feeling More Emotional: 4  Feeling Mentally Foggy: 5  Feeling Slowed Down: 6  Memory Problems: 5  Difficulty Concentrating: 5  Visual Problems: 5   Total # of Symptoms: 18/22 Total Symptom Score: 91/132  GAIT: Gait pattern: step to pattern, decreased step length- Right, decreased step length- Left, decreased stance time- Left, decreased stride length, antalgic, trendelenburg, wide BOS, poor foot clearance- Right, and poor foot clearance- Left Distance  walked: 73ft Assistive device utilized: None Level of assistance: Complete Independence Comments: antalgic gait, slowed speed  FUNCTIONAL TESTs:  5 times sit to stand: 25.85s w/slight  dizziness Timed up and go (TUG): 18.59s Berg Balance Scale: TBD  VESTIBULAR ASSESSMENT Horizontal and vertical saccades: cause dizziness VOR cancellation: x1 unable to maintain gaze after a few reps: dizziness. x2 double vision after 2 reps and dizziness  DVA: positive- 20/30 and 20/50 w/head turns Convergence: > 12in   PATIENT SURVEYS:  FOTO 43/100  TODAY'S TREATMENT:  03/26/22 Horizontal and vertical saccades looking at two different targets  Head turns with eyes on target seated VOR x2- 3 reps before too dizzy to carry on, second round 5 reps before needing to stop  Convergence/divergence with letter 2x10 Rows and ext redTB 2x10 Cervical traction w/MH 41mins, 10lbs     03/24/22 Saccades-horizontal, completed 6 reps before getting stuck, but no increase in dizziness. Round 2, 2 x before getting stuck Vert 4 reps, Round 2, 3 sets. Convergence x 10 reps, tolerated well, no increased dizziness. Standing on Airex pad, normal BOS x 30 sec. Dizziness up to 7/10, resolved in 1 minute. Round 2, dizziness to 5. Round 3, with  narrow BOS, 6. STM to upper cervical paraspinals, Up traps, LS along with shoulder circles, 5 x in each direction.  03/13/22 Horizontal Saccades x 10- dizziness to 4/10, Increased difficulty with her eyes getting "stuck" and unable to shift back toward the end. Dizziness did not recede after 3 minutes, so further saccades deferred. Supine on mat, STM-Very gentle, to cerv paraspinals, up traps, LS, pects B, R tighter than L. Initiated gentle PROM to neck and upper trunk. Patient with limited tolerance, Increased pain, so moved to AA shoulder shrugs and depression, and scapular abd/add MH and IFC e stim to cervical and upper trunk, 12 minutes, to tolerance.  Horizontal and vertical saccades Convergence w/letter    PATIENT EDUCATION: Education details: POC Person educated: Patient Education method: Explanation Education comprehension: verbalized  understanding   HOME EXERCISE PROGRAM: Access Code: [email or web address]   GOALS: Goals reviewed with patient? Yes  SHORT TERM GOALS: Target date: 04/18/22  Patient will be independent with initial HEP.  Goal status: ongoing  2.   Patient will report 50% improvement in concussion and dizziness symptoms.  Goal status: INITIAL   LONG TERM GOALS: Target date: 05/30/22  Patient will be independent with advanced/ongoing HEP to improve outcomes and carryover.  Goal status: INITIAL  2.  Patient will report improvement in concussion and dizziness symptoms w/ <65 on concussion symptom score.  Baseline: 91 Goal status: INITIAL  3.  Patient will demonstrate improved functional LE and UE strength as demonstrated by increase in 1 muscle grade. Goal status: INITIAL  4.  Patient to improve L shoulder and cervical AROM to [facility] without pain provocation to allow for increased ease of ADLs.  Goal status: INITIAL  5.  Patient will report 36 on FOTO to demonstrate improved functional ability.  Baseline: 43 Goal status: INITIAL  7.   Patient will demonstrate decreased fall risk by scoring < 15 sec on TUG and 5xSTS. Baseline: 18.59s, 25.85s Goal status: INITIAL   ASSESSMENT:  CLINICAL IMPRESSION: Patient still having concussion symptoms, states it has gone done some but not too much of a change. Her eyes get stuck with horizontal saccades after 6-7reps, and always happens to the left. Requires her to take a break and blink a few times to reset. Does better with vertical saccades but eyes  still get stuck after 8-9reps and slight dizziness. She continues to have double vision and dizziness with VOR exercises and still needs help with convergence. Tried traction today as patient complains of tightness and some numbness and tingling down her L side.   OBJECTIVE IMPAIRMENTS Abnormal gait, decreased activity tolerance, decreased balance, difficulty walking, decreased ROM, decreased strength, dizziness, impaired  UE functional use, improper body mechanics, postural dysfunction, and pain.   ACTIVITY LIMITATIONS carrying, lifting, bending, squatting, sleeping, and locomotion level  PARTICIPATION LIMITATIONS: cleaning, laundry, shopping, community activity, and occupation  REHAB POTENTIAL: Good  CLINICAL DECISION MAKING: Evolving/moderate complexity  EVALUATION COMPLEXITY: Moderate  PLAN: PT FREQUENCY: 1-2x/week  PT DURATION: 12 weeks  PLANNED INTERVENTIONS: Therapeutic exercises, Therapeutic activity, Neuromuscular re-education, Balance training, Gait training, Patient/Family education, Self Care, Joint mobilization, Stair training, Vestibular training, Canalith repositioning, Dry Needling, Electrical stimulation, Cryotherapy, Moist heat, Vasopneumatic device, Traction, Ionotophoresis 4mg /ml Dexamethasone, and Manual therapy  PLAN FOR NEXT SESSION:  more vision therapy, light strengthening for neck and UE, pain management, , DPT 03/26/2022, 11:50 AM

## 2022-03-26 ENCOUNTER — Ambulatory Visit: Payer: Medicare HMO

## 2022-03-26 DIAGNOSIS — M5459 Other low back pain: Secondary | ICD-10-CM

## 2022-03-26 DIAGNOSIS — M542 Cervicalgia: Secondary | ICD-10-CM | POA: Diagnosis not present

## 2022-03-26 DIAGNOSIS — M6281 Muscle weakness (generalized): Secondary | ICD-10-CM

## 2022-03-26 DIAGNOSIS — G4486 Cervicogenic headache: Secondary | ICD-10-CM

## 2022-03-26 DIAGNOSIS — M25552 Pain in left hip: Secondary | ICD-10-CM

## 2022-03-26 DIAGNOSIS — R2689 Other abnormalities of gait and mobility: Secondary | ICD-10-CM

## 2022-03-26 DIAGNOSIS — M25512 Pain in left shoulder: Secondary | ICD-10-CM

## 2022-03-26 DIAGNOSIS — R42 Dizziness and giddiness: Secondary | ICD-10-CM

## 2022-04-01 ENCOUNTER — Ambulatory Visit: Payer: Medicare HMO

## 2022-04-03 ENCOUNTER — Ambulatory Visit: Payer: Medicare HMO
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
## 2022-04-07 ENCOUNTER — Ambulatory Visit (INDEPENDENT_AMBULATORY_CARE_PROVIDER_SITE_OTHER): Payer: Medicare HMO | Admitting: Family Medicine

## 2022-04-07 VITALS — BP 128/82 | HR 94 | Ht 65.0 in | Wt 225.0 lb

## 2022-04-07 DIAGNOSIS — L299 Pruritus, unspecified: Secondary | ICD-10-CM

## 2022-04-07 DIAGNOSIS — G43711 Chronic migraine without aura, intractable, with status migrainosus: Secondary | ICD-10-CM | POA: Diagnosis not present

## 2022-04-07 DIAGNOSIS — S060X9D Concussion with loss of consciousness of unspecified duration, subsequent encounter: Secondary | ICD-10-CM | POA: Diagnosis not present

## 2022-04-07 MED ORDER — EPINEPHRINE 0.3 MG/0.3ML IJ SOAJ: 0.3000 mg | INTRAMUSCULAR | 1 refills | Status: DC | PRN

## 2022-04-07 NOTE — Patient Instructions (Signed)
Thank you for coming in today.   Try the Emgality again.  If you have an allergic reaction or bad itching again you can take benadryl and certizine.  You can also start the prednisone and use the Epi pen  I dont think you will need to but its nice to have a back up plan.   Recheck in 1 month.   You could also have restless leg syndrome.  Read up on it a little and let know what you think.   Continue the PT.   Restless Legs Syndrome Restless legs syndrome is a condition that causes uncomfortable feelings or sensations in the legs, especially while sitting or lying down. The sensations usually cause an overwhelming urge to move the legs. The arms can also sometimes be affected. The condition can range from mild to severe. The symptoms often interfere with a person's ability to sleep. What are the causes? The cause of this condition is not known. What increases the risk? The following factors may make you more likely to develop this condition: Being older than 50. Pregnancy. Being a woman. In general, the condition is more common in women than in men. A family history of the condition. Having iron deficiency. Overuse of caffeine, nicotine, or alcohol. Certain medical conditions, such as kidney disease, Parkinson's disease, or nerve damage. Certain medicines, such as those for high blood pressure, nausea, colds, allergies, depression, and some heart conditions. What are the signs or symptoms? The main symptom of this condition is uncomfortable sensations in the legs, such as: Pulling. Tingling. Prickling. Throbbing. Crawling. Burning. Usually, the sensations: Affect both sides of the body. Are worse when you sit or lie down. Are worse at night. These may make it difficult to fall asleep. Make you have a strong urge to move your legs. Are temporarily relieved by moving your legs or standing. The arms can also be affected, but this is rare. People who have this condition often  have tiredness during the day because of their lack of sleep at night. How is this diagnosed? This condition may be diagnosed based on: Your symptoms. Blood tests. In some cases, you may be monitored in a sleep lab by a specialist (a sleep study). This can detect any disruptions in your sleep. How is this treated? This condition is treated by managing the symptoms. This may include: Lifestyle changes, such as exercising, using relaxation techniques, and avoiding caffeine, alcohol, or tobacco. Iron supplements. Medicines. Parkinson's medications may be tried first. Anti-seizure medications can also be helpful. Follow these instructions at home: General instructions Take over-the-counter and prescription medicines only as told by your health care provider. Use methods to help relieve the uncomfortable sensations, such as: Massaging your legs. Walking or stretching. Taking a cold or hot bath. Keep all follow-up visits. This is important. Lifestyle     Practice good sleep habits. For example, go to bed and get up at the same time every day. Most adults should get 7-9 hours of sleep each night. Exercise regularly. Try to get at least 30 minutes of exercise most days of the week. Practice ways of relaxing, such as yoga or meditation. Avoid caffeine and alcohol. Do not use any products that contain nicotine or tobacco. These products include cigarettes, chewing tobacco, and vaping devices, such as e-cigarettes. If you need help quitting, ask your health care provider. Where to find more information [employer] of Neurological Disorders and Stroke: [employer] Contact a health care provider if: Your symptoms get worse or  they do not improve with treatment. Summary Restless legs syndrome is a condition that causes uncomfortable feelings or sensations in the legs, especially while sitting or lying down. The symptoms often interfere with your ability to sleep. This condition is  treated by managing the symptoms. You may need to make lifestyle changes or take medicines. This information is not intended to replace advice given to you by your health care provider. Make sure you discuss any questions you have with your health care provider. Document Revised: 02/24/2021 Document Reviewed: 02/24/2021 Elsevier Patient Education  2023 [employer].

## 2022-04-08 ENCOUNTER — Encounter: Payer: Self-pay | Admitting: Physical Therapy

## 2022-04-08 ENCOUNTER — Ambulatory Visit: Payer: Medicare HMO | Attending: Family Medicine | Admitting: Physical Therapy

## 2022-04-08 DIAGNOSIS — M25512 Pain in left shoulder: Secondary | ICD-10-CM | POA: Diagnosis present

## 2022-04-08 DIAGNOSIS — M542 Cervicalgia: Secondary | ICD-10-CM | POA: Diagnosis present

## 2022-04-08 DIAGNOSIS — R42 Dizziness and giddiness: Secondary | ICD-10-CM | POA: Diagnosis present

## 2022-04-08 DIAGNOSIS — M25552 Pain in left hip: Secondary | ICD-10-CM | POA: Diagnosis present

## 2022-04-08 DIAGNOSIS — G4486 Cervicogenic headache: Secondary | ICD-10-CM

## 2022-04-08 DIAGNOSIS — R2689 Other abnormalities of gait and mobility: Secondary | ICD-10-CM

## 2022-04-08 DIAGNOSIS — M6281 Muscle weakness (generalized): Secondary | ICD-10-CM

## 2022-04-08 DIAGNOSIS — M5459 Other low back pain: Secondary | ICD-10-CM | POA: Diagnosis present

## 2022-04-08 NOTE — Therapy (Addendum)
OUTPATIENT PHYSICAL THERAPY NEURO TREATMENT   Patient Name: Laura Medina MRN: [id number] DOB:1966/06/13, 56 y.o., female Today's Date: 04/08/2022   PCP: Gracy Bruins REFERRING PROVIDER: Sherene Sires   PT End of Session - 04/08/22 1104     Visit Number 5    Date for PT Re-Evaluation 05/30/22    Authorization Type [employer]    PT Start Time 1102    PT Stop Time 1142    PT Time Calculation (min) 40 min    Activity Tolerance Patient tolerated treatment well    Behavior During Therapy [facility] for tasks assessed/performed                Past Medical History:  Diagnosis Date   Arthritis    Fibromyalgia    Past Surgical History:  Procedure Laterality Date   blood clot evacuation     left leg   CHOLECYSTECTOMY     EXPLORATORY LAPAROTOMY     HEMORRHOID SURGERY     Patient Active Problem List   Diagnosis Date Noted   Rheumatoid arthritis of multiple sites without rheumatoid factor ([place]) 07/01/2016   High risk medication use 07/01/2016   Osteoarthritis, 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   Osteoarthritis, knee 07/01/2016   DDD (degenerative disc disease), lumbar 07/01/2016   Fibromyalgia 02/16/2013   Functional gait disorder 02/16/2013   Disturbance of skin sensation 02/16/2013   Pain in limb 02/16/2013    ONSET DATE: 01/15/22  REFERRING DIAG: [email or web address].[email or web address]  THERAPY DIAG:  Cervicalgia  Dizziness and giddiness  Muscle weakness (generalized)  Cervicogenic headache  Other abnormalities of gait and mobility  Acute pain of left shoulder  Other low back pain  Pain in left hip  Rationale for Evaluation and Treatment Rehabilitation  SUBJECTIVE:                                                                                                                                                                                              SUBJECTIVE STATEMENT: Patient reports relief from the traction. She also feels her dizziness has improved.   PERTINENT HISTORY: Fibromyalgia, arthritis    FALLS: Has patient fallen in last 6 months? 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 No acute intracranial abnormalities. No change from the recent studies.     IMPRESSION: No evidence of acute traumatic injury to the cervical spine.  Cervical spondylosis, as outlined. At C4-C5, a shallow disc bulge mildly effaces the ventral thecal sac and may contact the ventral spinal cord. No significant spinal canal stenosis at the remaining levels. No significant foraminal stenosis. Mild multilevel disc degeneration. Minimal degenerative endplate edema at W1-X9  COGNITION: Overall cognitive status: Within functional limits for tasks assessed   SENSATION: WFL  POSTURE: rounded shoulders, forward head, anterior pelvic tilt, and flexed trunk   LOWER EXTREMITY ROM:   WFL  LOWER EXTREMITY MMT:    MMT Right Eval Left Eval  Hip flexion 5 3+ w/pain  Hip extension    Hip abduction    Hip adduction    Hip internal rotation  4- w/pain  Hip external rotation  4- w/pain  Knee flexion 5 4- w/pain  Knee extension 5 4  Ankle dorsiflexion    Ankle plantarflexion    Ankle inversion    Ankle eversion    (Blank rows = not tested)  CERVICAL ROM:   Active ROM A/PROM (deg) eval  Flexion Pain at end range  Extension Mildly limitied with some pain   Right lateral flexion Mod tightness  Left lateral flexion Mod tightness w/pain  Right rotation 80d w/pain  Left rotation 80d    (Blank rows = not tested)  UPPER EXTREMITY ROM:  Active ROM Right eval Left eval  Shoulder flexion WNL 110 w/pain  Shoulder extension    Shoulder abduction WNL 94 w/pain  Shoulder adduction    Shoulder  extension    Shoulder internal rotation [facility] WFL  Shoulder external rotation [facility] WFL  Elbow flexion WNL WNL  Elbow extension WNL WNL  Wrist flexion    Wrist extension    Wrist ulnar deviation    Wrist radial deviation    Wrist pronation    Wrist supination     (Blank rows = not tested)  UPPER EXTREMITY MMT:  MMT Right eval Left eval  Shoulder flexion 3+ w/pain 2+ w/pain  Shoulder extension    Shoulder abduction 3+ w/pain 2+ w/pain  Shoulder adduction    Shoulder extension    Shoulder internal rotation 4+ 3+w/pain  Shoulder external rotation 4+ 3+ w/pain  Middle trapezius    Lower trapezius    Elbow flexion 5 4+ w/pain  Elbow extension 5 4+ w/pain  Wrist flexion    Wrist extension    Wrist ulnar deviation    Wrist radial deviation    Wrist pronation    Wrist supination    Grip strength     (Blank rows = not tested)   Concussion Self-Reported Symptom Score Symptoms rated on a scale 1-6, in last 24 hours    Headache: 6   Nausea: 0  Dizziness: 4  Vomiting: 0  Balance Difficulty: 5   Trouble Falling Asleep: 6   Fatigue: 6  Sleep Less Than Usual: 6  Daytime Drowsiness: 4  Sleep More Than Usual: 0  Photophobia: 5  Phonophobia: 5  Irritability: 4  Sadness: 0  Numbness or Tingling: 6  Nervousness: 4  Feeling More Emotional: 4  Feeling Mentally Foggy: 5  Feeling Slowed Down: 6  Memory Problems: 5  Difficulty Concentrating: 5  Visual Problems: 5   Total # of Symptoms: 18/22 Total Symptom Score: 91/132  GAIT: Gait pattern: step to pattern, decreased step length- Right, decreased step length- Left, decreased stance time- Left, decreased stride length, antalgic, trendelenburg, wide BOS, poor foot clearance- Right, and poor foot clearance- Left Distance walked: 70f Assistive device utilized: None Level of  assistance: Complete Independence Comments: antalgic gait, slowed speed  FUNCTIONAL TESTs:  5 times sit to stand: 25.85s w/slight dizziness Timed up and  go (TUG): 18.59s Berg Balance Scale: TBD  VESTIBULAR ASSESSMENT Horizontal and vertical saccades: cause dizziness VOR cancellation: x1 unable to maintain gaze after a few reps: dizziness. x2 double vision after 2 reps and dizziness  DVA: positive- 20/30 and 20/50 w/head turns Convergence: > 12in   PATIENT SURVEYS:  FOTO 43/100  TODAY'S TREATMENT:  04/08/22 Assessed dynamic visual acuity, which was (-) Assessed alternating eye cover and eye cover, both were (-) VOR x2- Horizontal, started 1, completed 10 reps with dizziness up to 3, 2 attempts, both to 3. Saccades-horizontal, completed 4 reps before getting stuck on first attempt and second attempt Vertical, 5 reps before getting stuck on first attempt, 4 on second attempt. STM to B cerv paraspinals, L up traps, pects, F/B stretch Cervical traction 5 min at 5#, 5 min at  03/26/22 Horizontal and vertical saccades looking at two different targets  Head turns with eyes on target seated VOR x2- 3 reps before too dizzy to carry on, second round 5 reps before needing to stop  Convergence/divergence with letter 2x10 Rows and ext redTB 2x10 Cervical traction w/MH 8mns, 10lbs     03/24/22 Saccades-horizontal, completed 6 reps before getting stuck, but no increase in dizziness. Round 2, 2 x before getting stuck Vert 4 reps, Round 2, 3 sets. Convergence x 10 reps, tolerated well, no increased dizziness. Standing on Airex pad, normal BOS x 30 sec. Dizziness up to 7/10, resolved in 1 minute. Round 2, dizziness to 5. Round 3, with  narrow BOS, 6. STM to upper cervical paraspinals, Up traps, LS along with shoulder circles, 5 x in each direction.  03/13/22 Horizontal Saccades x 10- dizziness to 4/10, Increased difficulty with her eyes getting "stuck" and unable to shift back toward the end. Dizziness did not recede after 3 minutes, so further saccades deferred. Supine on mat, STM-Very gentle, to cerv paraspinals, up traps, LS, pects B, R  tighter than L. Initiated gentle PROM to neck and upper trunk. Patient with limited tolerance, Increased pain, so moved to AA shoulder shrugs and depression, and scapular abd/add MH and IFC e stim to cervical and upper trunk, 12 minutes, to tolerance.  Horizontal and vertical saccades Convergence w/letter    PATIENT EDUCATION: Education details: POC Person educated: Patient Education method: Explanation Education comprehension: verbalized understanding   HOME EXERCISE PROGRAM: Access Code: [email or web address]  GOALS: Goals reviewed with patient? Yes  SHORT TERM GOALS: Target date: 04/18/22  Patient will be independent with initial HEP.  Goal status: ongoing  2.   Patient will report 50% improvement in concussion and dizziness symptoms.  Goal status: met   LONG TERM GOALS: Target date: 05/30/22  Patient will be independent with advanced/ongoing HEP to improve outcomes and carryover.  Goal status: INITIAL  2.  Patient will report improvement in concussion and dizziness symptoms w/ <65 on concussion symptom score.  Baseline: 91 Goal status: INITIAL  3.  Patient will demonstrate improved functional LE and UE strength as demonstrated by increase in 1 muscle grade. Goal status: ongoing  4.  Patient to improve L shoulder and [facility] to allow for increased ease of ADLs.  Goal status: ongoing  5.  Patient will report 581on FOTO to demonstrate improved functional ability.  Baseline: 43 Goal status: INITIAL  7.   Patient will demonstrate decreased fall  risk by scoring < 15 sec on TUG and 5xSTS. Baseline: 18.59s, 25.85s Goal status: INITIAL   ASSESSMENT:  CLINICAL IMPRESSION: Patient reports improvements in all areas of her concussion symptoms, but she still has headaches, dizziness and L shoulder and arm pain. She reports continued eye strain, especially in L eye. Further occular testing was (-), but she still has dizziness and gets stuck with  saccades and VOR exercises. She feels her L shoulder and arm pain are her biggest obstacles, so also focused on some strengthening and STM F/B traction. After traction, patient was notably unsteady and had to sit for several minutes to calm symptoms down.  OBJECTIVE IMPAIRMENTS Abnormal gait, decreased activity tolerance, decreased balance, difficulty walking, decreased ROM, decreased strength, dizziness, impaired UE functional use, improper body mechanics, postural dysfunction, and pain.   ACTIVITY LIMITATIONS carrying, lifting, bending, squatting, sleeping, and locomotion level  PARTICIPATION LIMITATIONS: cleaning, laundry, shopping, community activity, and occupation  REHAB POTENTIAL: Good  CLINICAL DECISION MAKING: Evolving/moderate complexity  EVALUATION COMPLEXITY: Moderate  PLAN: PT FREQUENCY: 1-2x/week  PT DURATION: 12 weeks  PLANNED INTERVENTIONS: Therapeutic exercises, Therapeutic activity, Neuromuscular re-education, Balance training, Gait training, Patient/Family education, Self Care, Joint mobilization, Stair training, Vestibular training, Canalith repositioning, Dry Needling, Electrical stimulation, Cryotherapy, Moist heat, Vasopneumatic device, Traction, Ionotophoresis 73m/ml Dexamethasone, and Manual therapy  PLAN FOR NEXT SESSION:  Update HEP to include postural strengthening and stretch. Assess traction benefits vs increased dizziness.   SEthel RanaDPT 04/08/22 11:44 AM  04/08/2022, 11:44 AM

## 2022-04-09 ENCOUNTER — Ambulatory Visit: Payer: Medicare HMO

## 2022-04-18 NOTE — Therapy (Signed)
OUTPATIENT PHYSICAL THERAPY NEURO TREATMENT   Patient Name: Laura Medina MRN: [id number] DOB:[birth year], 56 y.o., female Today's Date: 04/21/2022   PCP: Gracy Bruins REFERRING PROVIDER: Sherene Sires   PT End of Session - 04/21/22 1014     Visit Number 6    Date for PT Re-Evaluation 05/30/22    Authorization Type [employer]    PT Start Time 1015    PT Stop Time 1058    PT Time Calculation (min) 43 min    Activity Tolerance Patient tolerated treatment well    Behavior During Therapy [facility] for tasks assessed/performed                Past Medical History:  Diagnosis Date   Arthritis    Fibromyalgia    Past Surgical History:  Procedure Laterality Date   blood clot evacuation     left leg   CHOLECYSTECTOMY     EXPLORATORY LAPAROTOMY     HEMORRHOID SURGERY     Patient Active Problem List   Diagnosis Date Noted   Rheumatoid arthritis of multiple sites without rheumatoid factor ([place]) 07/01/2016   High risk medication use 07/01/2016   Osteoarthritis, 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   Osteoarthritis, knee 07/01/2016   DDD (degenerative disc disease), lumbar 07/01/2016   Fibromyalgia 02/16/2013   Functional gait disorder 02/16/2013   Disturbance of skin sensation 02/16/2013   Pain in limb 02/16/2013    ONSET DATE: 01/15/22  REFERRING DIAG: [email or web address].Nella.[email or web address]  THERAPY DIAG:  Acute pain of left shoulder  Cervicalgia  Dizziness and giddiness  Muscle weakness (generalized)  Other abnormalities of gait and mobility  Rationale for Evaluation and Treatment Rehabilitation  SUBJECTIVE:                                                                                                                                                                                              SUBJECTIVE STATEMENT: I am feeling better but the left arm is still giving me trouble. 8/10 pain.    PERTINENT HISTORY: Fibromyalgia, arthritis   FALLS: Has patient fallen in last 6 months? 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 No acute intracranial abnormalities. No change from the recent studies.     IMPRESSION: No evidence of acute traumatic injury to the cervical spine.   Cervical spondylosis, as outlined. At C4-C5, a shallow disc  bulge mildly effaces the ventral thecal sac and may contact the ventral spinal cord. No significant spinal canal stenosis at the remaining levels. No significant foraminal stenosis. Mild multilevel disc degeneration. Minimal degenerative endplate edema at B0-S1  COGNITION: Overall cognitive status: Within functional limits for tasks assessed   SENSATION: WFL  POSTURE: rounded shoulders, forward head, anterior pelvic tilt, and flexed trunk   LOWER EXTREMITY ROM:   WFL  LOWER EXTREMITY MMT:    MMT Right Eval Left Eval  Hip flexion 5 3+ w/pain  Hip extension    Hip abduction    Hip adduction    Hip internal rotation  4- w/pain  Hip external rotation  4- w/pain  Knee flexion 5 4- w/pain  Knee extension 5 4  Ankle dorsiflexion    Ankle plantarflexion    Ankle inversion    Ankle eversion    (Blank rows = not tested)  CERVICAL ROM:   Active ROM A/PROM (deg) eval 04/21/22  Flexion Pain at end range [facility]  Extension Mildly limitied with some pain  WFL  Right lateral flexion Mod tightness Mod tightness  Left lateral flexion Mod tightness w/pain Mod tightness  Right rotation 80d w/pain WFL no pain just tightness  Left rotation 80d  WFL    (Blank rows = not tested)  UPPER EXTREMITY ROM:  Active ROM Right eval Left eval  Shoulder flexion WNL 110 w/pain  Shoulder extension    Shoulder abduction WNL 94 w/pain  Shoulder adduction     Shoulder extension    Shoulder internal rotation [facility] WFL  Shoulder external rotation [facility] WFL  Elbow flexion WNL WNL  Elbow extension WNL WNL  Wrist flexion    Wrist extension    Wrist ulnar deviation    Wrist radial deviation    Wrist pronation    Wrist supination     (Blank rows = not tested)  UPPER EXTREMITY MMT:  MMT Right eval Left eval  Shoulder flexion 3+ w/pain 2+ w/pain  Shoulder extension    Shoulder abduction 3+ w/pain 2+ w/pain  Shoulder adduction    Shoulder extension    Shoulder internal rotation 4+ 3+w/pain  Shoulder external rotation 4+ 3+ w/pain  Middle trapezius    Lower trapezius    Elbow flexion 5 4+ w/pain  Elbow extension 5 4+ w/pain  Wrist flexion    Wrist extension    Wrist ulnar deviation    Wrist radial deviation    Wrist pronation    Wrist supination    Grip strength     (Blank rows = not tested)   Concussion Self-Reported Symptom Score Symptoms rated on a scale 1-6, in last 24 hours    Headache: 6 0   Nausea: 0 0  Dizziness: 4 0   Vomiting: 0 0  Balance Difficulty: 5 0  Trouble Falling Asleep: 6 1  Fatigue: 6 5  Sleep Less Than Usual: 6 5  Daytime Drowsiness: 4 4  Sleep More Than Usual: 0 0   Photophobia: 5 2  Phonophobia: 5 3  Irritability: 4 2  Sadness: 0  0  Numbness or Tingling: 6 6  Nervousness: 4 2  Feeling More Emotional: 4 0  Feeling Mentally Foggy: 5 2  Feeling Slowed Down: 6 2  Memory Problems: 5 5  Difficulty Concentrating: 5 4  Visual Problems: 5 5   Total # of Symptoms: 18/22 Total Symptom Score: 91/132, 48  GAIT: Gait pattern: step to pattern, decreased step length- Right, decreased step length- Left,  decreased stance time- Left, decreased stride length, antalgic, trendelenburg, wide BOS, poor foot clearance- Right, and poor foot clearance- Left Distance walked: 77f Assistive device utilized: None Level of assistance: Complete Independence Comments: antalgic gait, slowed speed  FUNCTIONAL TESTs:  5  times sit to stand: 25.85s w/slight dizziness Timed up and go (TUG): 18.59s Berg Balance Scale: TBD  VESTIBULAR ASSESSMENT Horizontal and vertical saccades: cause dizziness VOR cancellation: x1 unable to maintain gaze after a few reps: dizziness. x2 double vision after 2 reps and dizziness  DVA: positive- 20/30 and 20/50 w/head turns Convergence: > 12in   PATIENT SURVEYS:  FOTO 43/100  TODAY'S TREATMENT:  04/21/22 Nustep L5 x675ms 5xSTS and TUG  Concussion symptom score Shoulder flexion 2# WaTe 2x10 Rows and ext greenTB 2x10 ER/IR redTB LUE x10  ER with resistance yellow band x10 Horizontal ABD yellowTB  Red ball roll up wall x10  04/08/22 Assessed dynamic visual acuity, which was (-) Assessed alternating eye cover and eye cover, both were (-) VOR x2- Horizontal, started 1, completed 10 reps with dizziness up to 3, 2 attempts, both to 3. Saccades-horizontal, completed 4 reps before getting stuck on first attempt and second attempt Vertical, 5 reps before getting stuck on first attempt, 4 on second attempt. STM to B cerv paraspinals, L up traps, pects, F/B stretch Cervical traction 5 min at 5#, 5 min at  03/26/22 Horizontal and vertical saccades looking at two different targets  Head turns with eyes on target seated VOR x2- 3 reps before too dizzy to carry on, second round 5 reps before needing to stop  Convergence/divergence with letter 2x10 Rows and ext redTB 2x10 Cervical traction w/MH 1044m, 10lbs     03/24/22 Saccades-horizontal, completed 6 reps before getting stuck, but no increase in dizziness. Round 2, 2 x before getting stuck Vert 4 reps, Round 2, 3 sets. Convergence x 10 reps, tolerated well, no increased dizziness. Standing on Airex pad, normal BOS x 30 sec. Dizziness up to 7/10, resolved in 1 minute. Round 2, dizziness to 5. Round 3, with  narrow BOS, 6. STM to upper cervical paraspinals, Up traps, LS along with shoulder circles, 5 x in each  direction.  03/13/22 Horizontal Saccades x 10- dizziness to 4/10, Increased difficulty with her eyes getting "stuck" and unable to shift back toward the end. Dizziness did not recede after 3 minutes, so further saccades deferred. Supine on mat, STM-Very gentle, to cerv paraspinals, up traps, LS, pects B, R tighter than L. Initiated gentle PROM to neck and upper trunk. Patient with limited tolerance, Increased pain, so moved to AA shoulder shrugs and depression, and scapular abd/add MH and IFC e stim to cervical and upper trunk, 12 minutes, to tolerance.  Horizontal and vertical saccades Convergence w/letter    PATIENT EDUCATION: Education details: POC Person educated: Patient Education method: Explanation Education comprehension: verbalized understanding   HOME EXERCISE PROGRAM: Access Code: [id number]: Goals reviewed with patient? Yes  SHORT TERM GOALS: Target date: 04/18/22  Patient will be independent with initial HEP.  Goal status: ongoing  2.   Patient will report 50% improvement in concussion and dizziness symptoms.  Goal status: met 04/21/22   LONG TERM GOALS: Target date: 05/30/22  Patient will be independent with advanced/ongoing HEP to improve outcomes and carryover.  Goal status: INITIAL  2.  Patient will report improvement in concussion and dizziness symptoms w/ <65 on concussion symptom score.  Baseline: 91, 48-9/25 Goal status: MET  3.  Patient will  demonstrate improved functional LE and UE strength as demonstrated by increase in 1 muscle grade. Goal status: INITIAL  4.  Patient to improve L shoulder and cervical AROM to [facility] without pain provocation to allow for increased ease of ADLs.  Goal status: INITIAL  5.  Patient will report 77 on FOTO to demonstrate improved functional ability.  Baseline: 43 Goal status: INITIAL  6.   Patient will demonstrate decreased fall risk by scoring < 15 sec on TUG and 5xSTS. Baseline: 18.59s, 25.85s 9/25- 12.19s  and 13.33s  Goal status: Met 04/21/22   ASSESSMENT:  CLINICAL IMPRESSION: Patient reports improvements in all areas of her concussion symptoms, we reassessed some goals and she has made progress and met some LTGs. She reports high pain levels for her L shoulder and arm pain and numbness/tingling. So we focused on some UE exercises today. Some difficulty noted with therband exercises, modified as needed. Try traction next visit again to see it will help with radicular symptoms.  OBJECTIVE IMPAIRMENTS Abnormal gait, decreased activity tolerance, decreased balance, difficulty walking, decreased ROM, decreased strength, dizziness, impaired UE functional use, improper body mechanics, postural dysfunction, and pain.   ACTIVITY LIMITATIONS carrying, lifting, bending, squatting, sleeping, and locomotion level  PARTICIPATION LIMITATIONS: cleaning, laundry, shopping, community activity, and occupation  REHAB POTENTIAL: Good  CLINICAL DECISION MAKING: Evolving/moderate complexity  EVALUATION COMPLEXITY: Moderate  PLAN: PT FREQUENCY: 1-2x/week  PT DURATION: 12 weeks  PLANNED INTERVENTIONS: Therapeutic exercises, Therapeutic activity, Neuromuscular re-education, Balance training, Gait training, Patient/Family education, Self Care, Joint mobilization, Stair training, Vestibular training, Canalith repositioning, Dry Needling, Electrical stimulation, Cryotherapy, Moist heat, Vasopneumatic device, Traction, Ionotophoresis 89m/ml Dexamethasone, and Manual therapy  PLAN FOR NEXT SESSION:  cervical traction, UE strengthening, balance tasks   MAndris Baumann DPT 04/21/2022, 10:59 AM

## 2022-04-21 ENCOUNTER — Ambulatory Visit: Payer: Medicare HMO

## 2022-04-21 DIAGNOSIS — R2689 Other abnormalities of gait and mobility: Secondary | ICD-10-CM

## 2022-04-21 DIAGNOSIS — M6281 Muscle weakness (generalized): Secondary | ICD-10-CM

## 2022-04-21 DIAGNOSIS — M542 Cervicalgia: Secondary | ICD-10-CM | POA: Diagnosis not present

## 2022-04-21 DIAGNOSIS — R42 Dizziness and giddiness: Secondary | ICD-10-CM

## 2022-04-21 DIAGNOSIS — M25512 Pain in left shoulder: Secondary | ICD-10-CM

## 2022-04-30 ENCOUNTER — Other Ambulatory Visit: Payer: Self-pay | Admitting: Family Medicine

## 2022-05-02 ENCOUNTER — Ambulatory Visit: Payer: Medicare HMO
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
## 2022-05-07 ENCOUNTER — Ambulatory Visit (INDEPENDENT_AMBULATORY_CARE_PROVIDER_SITE_OTHER): Payer: Medicare HMO | Admitting: Family Medicine

## 2022-05-07 VITALS — BP 132/74 | HR 99 | Ht 65.0 in | Wt 229.0 lb

## 2022-05-07 DIAGNOSIS — S060X9D Concussion with loss of consciousness of unspecified duration, subsequent encounter: Secondary | ICD-10-CM

## 2022-05-07 DIAGNOSIS — G43711 Chronic migraine without aura, intractable, with status migrainosus: Secondary | ICD-10-CM | POA: Diagnosis not present

## 2022-05-07 MED ORDER — SUMATRIPTAN SUCCINATE 50 MG PO TABS: 50.0000 mg | ORAL_TABLET | ORAL | 0 refills | Status: DC | PRN

## 2022-05-07 NOTE — Patient Instructions (Signed)
Thank you for coming in today.   Use sumatriptan (imitrex) as needed for severe headache.   Continue Emgality every month.   Recheck in 1 month.   I am happy that things are slowly improving.

## 2022-05-08 ENCOUNTER — Encounter: Payer: Self-pay | Admitting: Physical Therapy

## 2022-05-08 ENCOUNTER — Ambulatory Visit: Payer: Medicare HMO | Attending: Family Medicine | Admitting: Physical Therapy

## 2022-05-08 DIAGNOSIS — R42 Dizziness and giddiness: Secondary | ICD-10-CM | POA: Diagnosis present

## 2022-05-08 DIAGNOSIS — G4486 Cervicogenic headache: Secondary | ICD-10-CM | POA: Diagnosis present

## 2022-05-08 DIAGNOSIS — M542 Cervicalgia: Secondary | ICD-10-CM | POA: Insufficient documentation

## 2022-05-08 DIAGNOSIS — M25512 Pain in left shoulder: Secondary | ICD-10-CM | POA: Insufficient documentation

## 2022-05-08 DIAGNOSIS — M6281 Muscle weakness (generalized): Secondary | ICD-10-CM | POA: Insufficient documentation

## 2022-05-08 NOTE — Therapy (Signed)
OUTPATIENT PHYSICAL THERAPY NEURO TREATMENT   Patient Name: Laura Medina MRN: [id number] DOB:[birth year], 56 y.o., female Today's Date: 05/08/2022   PCP: Gracy Bruins REFERRING PROVIDER: Sherene Sires   PT End of Session - 05/08/22 1316     Visit Number 7    Date for PT Re-Evaluation 05/30/22    PT Start Time 1313    PT Stop Time 1354    PT Time Calculation (min) 41 min    Activity Tolerance Patient tolerated treatment well    Behavior During Therapy [facility] for tasks assessed/performed                 Past Medical History:  Diagnosis Date   Arthritis    Fibromyalgia    Past Surgical History:  Procedure Laterality Date   blood clot evacuation     left leg   CHOLECYSTECTOMY     EXPLORATORY LAPAROTOMY     HEMORRHOID SURGERY     Patient Active Problem List   Diagnosis Date Noted   Rheumatoid arthritis of multiple sites without rheumatoid factor ([place]) 07/01/2016   High risk medication use 07/01/2016   Osteoarthritis, 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   Osteoarthritis, knee 07/01/2016   DDD (degenerative disc disease), lumbar 07/01/2016   Fibromyalgia 02/16/2013   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 The max pain is about 5/10. She received a steroid shot in her upper back last week. Does not notice a big difference in pain.  PERTINENT HISTORY: Fibromyalgia, arthritis   FALLS: Has patient  fallen in last 6 months? 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 No acute intracranial abnormalities. No change from the recent studies.     IMPRESSION: No evidence of acute traumatic injury to the cervical spine.   Cervical spondylosis,  as outlined. At C4-C5, a shallow disc bulge mildly effaces the ventral thecal sac and may contact the ventral spinal cord. No significant spinal canal stenosis at the remaining levels. No significant foraminal stenosis. Mild multilevel disc degeneration. Minimal degenerative endplate edema at W2-X9  COGNITION: Overall cognitive status: Within functional limits for tasks assessed   SENSATION: WFL  POSTURE: rounded shoulders, forward head, anterior pelvic tilt, and flexed trunk   LOWER EXTREMITY ROM:   WFL  LOWER EXTREMITY MMT:    MMT Right Eval Left Eval  Hip flexion 5 3+ w/pain  Hip extension    Hip abduction    Hip adduction    Hip internal rotation  4- w/pain  Hip external rotation  4- w/pain  Knee flexion 5 4- w/pain  Knee extension 5 4  Ankle dorsiflexion    Ankle plantarflexion    Ankle inversion    Ankle eversion    (Blank rows = not tested)  CERVICAL ROM:   Active ROM A/PROM (deg) eval 04/21/22  Flexion Pain at end range [facility]  Extension Mildly limitied with some pain  WFL  Right lateral flexion Mod tightness Mod tightness  Left lateral flexion Mod tightness w/pain Mod tightness  Right rotation 80d w/pain WFL no pain just tightness  Left rotation 80d  WFL    (Blank rows = not tested)  UPPER EXTREMITY ROM:  Active ROM Right eval Left eval  Shoulder flexion WNL 110 w/pain  Shoulder extension    Shoulder abduction WNL  94 w/pain  Shoulder adduction    Shoulder extension    Shoulder internal rotation [facility] WFL  Shoulder external rotation [facility] WFL  Elbow flexion WNL WNL  Elbow extension WNL WNL  Wrist flexion    Wrist extension    Wrist ulnar deviation    Wrist radial deviation    Wrist pronation    Wrist supination     (Blank rows = not tested)  UPPER EXTREMITY MMT:  MMT Right eval Left eval  Shoulder flexion 3+ w/pain 2+ w/pain  Shoulder extension    Shoulder abduction 3+ w/pain 2+ w/pain  Shoulder adduction    Shoulder extension    Shoulder internal rotation 4+ 3+w/pain  Shoulder external rotation 4+ 3+ w/pain  Middle trapezius    Lower trapezius    Elbow flexion 5 4+ w/pain  Elbow extension 5 4+ w/pain  Wrist flexion    Wrist extension    Wrist ulnar deviation    Wrist radial deviation    Wrist pronation    Wrist supination    Grip strength     (Blank rows = not tested)   Concussion Self-Reported Symptom Score Symptoms rated on a scale 1-6, in last 24 hours    Headache: 6 0   Nausea: 0 0  Dizziness: 4 0   Vomiting: 0 0  Balance Difficulty: 5 0  Trouble Falling Asleep: 6 1  Fatigue: 6 5  Sleep Less Than Usual: 6 5  Daytime Drowsiness: 4 4  Sleep More Than Usual: 0 0   Photophobia: 5 2  Phonophobia: 5 3  Irritability: 4 2  Sadness: 0  0  Numbness or Tingling: 6 6  Nervousness: 4 2  Feeling More Emotional: 4 0  Feeling Mentally Foggy: 5 2  Feeling Slowed Down: 6 2  Memory Problems: 5 5  Difficulty Concentrating: 5 4  Visual Problems: 5 5   Total # of Symptoms: 18/22 Total Symptom Score: 91/132, 48  GAIT: Gait pattern: step to pattern, decreased  step length- Right, decreased step length- Left, decreased stance time- Left, decreased stride length, antalgic, trendelenburg, wide BOS, poor foot clearance- Right, and poor foot clearance- Left Distance walked: 79f Assistive device utilized: None Level of assistance: Complete Independence Comments: antalgic gait,  slowed speed  FUNCTIONAL TESTs:  5 times sit to stand: 25.85s w/slight dizziness Timed up and go (TUG): 18.59s Berg Balance Scale: TBD  VESTIBULAR ASSESSMENT Horizontal and vertical saccades: cause dizziness VOR cancellation: x1 unable to maintain gaze after a few reps: dizziness. x2 double vision after 2 reps and dizziness  DVA: positive- 20/30 and 20/50 w/head turns Convergence: > 12in   PATIENT SURVEYS:  FOTO 43/100  TODAY'S TREATMENT:  05/08/22 NuStep L5 x 6 minutes STM to B up traps, rhomboids, medial scapular borders Active stretch to neck-UT, Medial scapular border, pects Shoulder ext, rows, ER against Green Tband resistance, 2 x 10 Additional STM to up traps, rhomboids, medial scapular border with deep pressure to knots DN by MRoma Schanz PT MH to neck and upper shoulders x 10 minutes  04/21/22 Nustep L5 x633ms 5xSTS and TUG  Concussion symptom score Shoulder flexion 2# WaTe 2x10 Rows and ext greenTB 2x10 ER/IR redTB LUE x10  ER with resistance yellow band x10 Horizontal ABD yellowTB  Red ball roll up wall x10  04/08/22 Assessed dynamic visual acuity, which was (-) Assessed alternating eye cover and eye cover, both were (-) VOR x2- Horizontal, started 1, completed 10 reps with dizziness up to 3, 2 attempts, both to 3. Saccades-horizontal, completed 4 reps before getting stuck on first attempt and second attempt Vertical, 5 reps before getting stuck on first attempt, 4 on second attempt. STM to B cerv paraspinals, L up traps, pects, F/B stretch Cervical traction 5 min at 5#, 5 min at  03/26/22 Horizontal and vertical saccades looking at two different targets  Head turns with eyes on target seated VOR x2- 3 reps before too dizzy to carry on, second round 5 reps before needing to stop  Convergence/divergence with letter 2x10 Rows and ext redTB 2x10 Cervical traction w/MH 1055m, 10lbs     03/24/22 Saccades-horizontal, completed 6 reps before getting stuck,  but no increase in dizziness. Round 2, 2 x before getting stuck Vert 4 reps, Round 2, 3 sets. Convergence x 10 reps, tolerated well, no increased dizziness. Standing on Airex pad, normal BOS x 30 sec. Dizziness up to 7/10, resolved in 1 minute. Round 2, dizziness to 5. Round 3, with  narrow BOS, 6. STM to upper cervical paraspinals, Up traps, LS along with shoulder circles, 5 x in each direction.  03/13/22 Horizontal Saccades x 10- dizziness to 4/10, Increased difficulty with her eyes getting "stuck" and unable to shift back toward the end. Dizziness did not recede after 3 minutes, so further saccades deferred. Supine on mat, STM-Very gentle, to cerv paraspinals, up traps, LS, pects B, R tighter than L. Initiated gentle PROM to neck and upper trunk. Patient with limited tolerance, Increased pain, so moved to AA shoulder shrugs and depression, and scapular abd/add MH and IFC e stim to cervical and upper trunk, 12 minutes, to tolerance.  Horizontal and vertical saccades Convergence w/letter    PATIENT EDUCATION: Education details: POC Person educated: Patient Education method: Explanation Education comprehension: verbalized understanding   HOME EXERCISE PROGRAM: Access Code: [email or web address]: Goals reviewed with patient? Yes  SHORT TERM GOALS: Target date: 04/18/22  Patient will be independent with initial HEP.  Goal status: ongoing  2.  Patient will report 50% improvement in concussion and dizziness symptoms.  Goal status: met 04/21/22   LONG TERM GOALS: Target date: 05/30/22  Patient will be independent with advanced/ongoing HEP to improve outcomes and carryover.  Goal status: INITIAL  2.  Patient will report improvement in concussion and dizziness symptoms w/ <65 on concussion symptom score.  Baseline: 91, 48-9/25 Goal status: MET  3.  Patient will demonstrate improved functional LE and UE strength as demonstrated by increase in 1 muscle grade. Goal status:  INITIAL  4.  Patient to improve L shoulder and cervical AROM to [facility] without pain provocation to allow for increased ease of ADLs.  Goal status: INITIAL  5.  Patient will report 51 on FOTO to demonstrate improved functional ability.  Baseline: 43 Goal status: INITIAL  6.   Patient will demonstrate decreased fall risk by scoring < 15 sec on TUG and 5xSTS. Baseline: 18.59s, 25.85s 9/25- 12.19s and 13.33s  Goal status: Met 04/21/22   ASSESSMENT:  CLINICAL IMPRESSION: Patient reports improvements in all areas of her concussion symptoms. She is still very tight and sore in upper shoulders and neck. Declined traction. HEP updated to include additional stretch and strengthening for upper trunk. Provided DN and MH for pain relief.  OBJECTIVE IMPAIRMENTS Abnormal gait, decreased activity tolerance, decreased balance, difficulty walking, decreased ROM, decreased strength, dizziness, impaired UE functional use, improper body mechanics, postural dysfunction, and pain.   ACTIVITY LIMITATIONS carrying, lifting, bending, squatting, sleeping, and locomotion level  PARTICIPATION LIMITATIONS: cleaning, laundry, shopping, community activity, and occupation  REHAB POTENTIAL: Good  CLINICAL DECISION MAKING: Evolving/moderate complexity  EVALUATION COMPLEXITY: Moderate  PLAN: PT FREQUENCY: 1-2x/week  PT DURATION: 12 weeks  PLANNED INTERVENTIONS: Therapeutic exercises, Therapeutic activity, Neuromuscular re-education, Balance training, Gait training, Patient/Family education, Self Care, Joint mobilization, Stair training, Vestibular training, Canalith repositioning, Dry Needling, Electrical stimulation, Cryotherapy, Moist heat, Vasopneumatic device, Traction, Ionotophoresis 55m/ml Dexamethasone, and Manual therapy  PLAN FOR NEXT SESSION:  Assess tolerance to DN., UE strengthening, balance tasks   SEthel RanaDPT  05/08/2022, 2:46 PM

## 2022-05-09 ENCOUNTER — Ambulatory Visit
Admission: EM | Admit: 2022-05-09 | Discharge: 2022-05-09 | Disposition: A | Discharge status: Home / Self Care | Payer: Medicare HMO | Attending: Urgent Care | Admitting: Urgent Care

## 2022-05-09 ENCOUNTER — Telehealth: Payer: Self-pay | Admitting: Family Medicine

## 2022-05-09 ENCOUNTER — Encounter: Payer: Self-pay | Admitting: Family Medicine

## 2022-05-09 DIAGNOSIS — R609 Edema, unspecified: Secondary | ICD-10-CM

## 2022-05-09 DIAGNOSIS — T50905A Adverse effect of unspecified drugs, medicaments and biological substances, initial encounter: Secondary | ICD-10-CM | POA: Diagnosis not present

## 2022-05-09 MED ORDER — PREDNISONE 20 MG PO TABS: ORAL_TABLET | ORAL | 0 refills | Status: DC

## 2022-05-09 NOTE — Telephone Encounter (Signed)
Patient called in regards to the new medication (SUMAtriptan (IMITREX) 50 MG tablet) that was prescribed.  She said that she started having an allergic reaction when beginning the medication. Her face felt very itching and then her throat felt like it was closing up. She took Benadryl which seemed to help but her throat still feels very sore.  She is going to UC today to be evaluated.

## 2022-05-09 NOTE — ED Provider Notes (Addendum)
[facility]  Note:  This document was prepared using Conservation officer, historic buildings and may include unintentional dictation errors.  MRN: [id number] DOB: [birth year]  Subjective:   Aleera Binkowski is a 56 y.o. female presenting for concern for an allergic reaction.  Patient took Imitrex last night and was up for 7 she had ever used that medication.  Reports that she ended up getting tongue swelling, throat closing sensation, swelling in her throat.  She took a Benadryl last night and this helped.  She contacted her PCP this morning and was advised to come into our clinic.  They placed sumatriptan on her allergy list.  No chest pain, shortness of breath, chest tightness, wheezing, difficulty swallowing, rashes.  She does have an EpiPen that she is able to use but did not feel like using it yesterday.  She did get a prescription for prednisone related to headaches that she has not filled.  No current facility-administered medications for this encounter.  Current Outpatient Medications:    amLODipine (NORVASC) 10 MG tablet, TAKE 1 TABLET(10 MG) BY MOUTH DAILY, Disp: , Rfl:    augmented betamethasone dipropionate (DIPROLENE-AF) 0.05 % ointment, Apply to scalp 3-4 times per week, Disp: , Rfl:    Azelastine HCl 0.15 % SOLN, , Disp: , Rfl:    Capsaicin 0.1 % CREA, Apply to bilateral knees every 8 hours as needed., Disp: , Rfl:    carbamide peroxide (DEBROX) 6.5 % OTIC solution, Place 5 drops into both ears 2 (two) times daily., Disp: 15 mL, Rfl: 0   cetirizine (ZYRTEC ALLERGY) 10 MG tablet, Take 1 tablet (10 mg total) by mouth daily., Disp: 30 tablet, Rfl: 0   cholecalciferol (VITAMIN D) 1000 units tablet, Take 1,000 Units by mouth daily., Disp: , Rfl:    Cholecalciferol 1.25 MG (50000 UT) capsule, TAKE 1 CAPSULE BY MOUTH WEEKLY FOR 3 MONTHS, Disp: , Rfl:    EPINEPHrine 0.3 mg/0.3 mL IJ SOAJ injection, Inject 0.3 mg into the muscle as needed for anaphylaxis., Disp: 1 each,  Rfl: 1   famotidine (PEPCID) 20 MG tablet, Take 1 tablet (20 mg total) by mouth 2 (two) times daily., Disp: 30 tablet, Rfl: 0   finasteride (PROSCAR) 5 MG tablet, Take 5 mg by mouth daily., Disp: , Rfl:    Galcanezumab-gnlm (EMGALITY) 120 MG/ML SOAJ, Inject 120 mg into the skin every 30 (thirty) days. After the first dose, Disp: 1.12 mL, Rfl: 12   linaclotide (LINZESS) 145 MCG CAPS capsule, Take by mouth., Disp: , Rfl:    predniSONE (STERAPRED UNI-PAK 48 TAB) 5 MG (48) TBPK tablet, 12 day dosepack po, Disp: 48 tablet, Rfl: 0   RESTASIS 0.05 % ophthalmic emulsion, , Disp: , Rfl:    SUMAtriptan (IMITREX) 50 MG tablet, Take 1 tablet (50 mg total) by mouth every 2 (two) hours as needed for migraine. May repeat in 2 hours if headache persists or recurs., Disp: 30 tablet, Rfl: 0   traZODone (DESYREL) 50 MG tablet, TAKE 1 TABLET BY MOUTH EVERYDAY AT BEDTIME, Disp: 30 tablet, Rfl: 1   valACYclovir (VALTREX) 1000 MG tablet, , Disp: , Rfl:    Allergies  Allergen Reactions   Celecoxib Diarrhea and Itching   Duloxetine Itching   Hydrocodone-Acetaminophen Itching   Hydroxychloroquine Itching   Oxycodone-Acetaminophen Itching   Sumatriptan Swelling    Tongue and throat swelling.   Methotrexate Rash   Buprenorphine     itching   Gabapentin Itching   Lyrica [Pregabalin]  itching   Morphine Other (See Comments)    unknown   Nucynta [Tapentadol]     itching   Quinine Derivatives Itching   Sulfur     Burning sensation   Topamax [Topiramate] Itching and Swelling   Acetaminophen-Codeine Itching   Hydrocodone Itching   Oxycodone Itching   Penicillins Itching    Past Medical History:  Diagnosis Date   Arthritis    Fibromyalgia      Past Surgical History:  Procedure Laterality Date   blood clot evacuation     left leg   CHOLECYSTECTOMY     EXPLORATORY LAPAROTOMY     HEMORRHOID SURGERY      Family History  Problem Relation Age of Onset   Lung cancer Paternal Grandmother      Social History   Tobacco Use   Smoking status: Never   Smokeless tobacco: Never  Vaping Use   Vaping Use: Never used  Substance Use Topics   Alcohol use: No   Drug use: No    ROS   Objective:   Vitals: BP 125/86 (BP Location: Right Arm)   Pulse (!) 108   Temp 98.8 F (37.1 C) (Oral)   Resp 16   LMP 10/27/2016 (Exact Date)   SpO2 96%   Physical Exam Constitutional:      General: She is not in acute distress.    Appearance: Normal appearance. She is well-developed. She is obese. She is not ill-appearing, toxic-appearing or diaphoretic.  HENT:     Head: Normocephalic and atraumatic.     Comments: No facial swelling.    Right Ear: Tympanic membrane, ear canal and external ear normal. No drainage or tenderness. No middle ear effusion. There is no impacted cerumen. Tympanic membrane is not erythematous.     Left Ear: Tympanic membrane, ear canal and external ear normal. No drainage or tenderness.  No middle ear effusion. There is no impacted cerumen. Tympanic membrane is not erythematous.     Nose: Nose normal. No congestion or rhinorrhea.     Mouth/Throat:     Mouth: Mucous membranes are moist. No oral lesions.     Pharynx: No pharyngeal swelling, oropharyngeal exudate, posterior oropharyngeal erythema or uvula swelling.     Tonsils: No tonsillar exudate or tonsillar abscesses.     Comments: No appreciable oral swelling.  Airway is patent.  Patient is controlling secretions and speaking in full sentences. Eyes:     General: No scleral icterus.       Right eye: No discharge.        Left eye: No discharge.     Extraocular Movements: Extraocular movements intact.     Right eye: Normal extraocular motion.     Left eye: Normal extraocular motion.     Conjunctiva/sclera: Conjunctivae normal.  Cardiovascular:     Rate and Rhythm: Normal rate and regular rhythm.     Heart sounds: Normal heart sounds. No murmur heard.    No friction rub. No gallop.  Pulmonary:      Effort: Pulmonary effort is normal. No respiratory distress.     Breath sounds: No stridor. No wheezing, rhonchi or rales.  Chest:     Chest wall: No tenderness.  Musculoskeletal:     Cervical back: Normal range of motion and neck supple.  Lymphadenopathy:     Cervical: No cervical adenopathy.  Skin:    General: Skin is warm and dry.     Findings: No rash.  Neurological:     General: No  focal deficit present.     Mental Status: She is alert and oriented to person, place, and time.  Psychiatric:        Mood and Affect: Mood normal.        Behavior: Behavior normal.     Assessment and Plan :   PDMP not reviewed this encounter.  1. Swelling   2. Medication reaction, initial encounter     I recommended using hydroxyzine but patient had significant concerns for severe infection and therefore I offered her oral prednisone.  Recommended follow-up with her PCP.  Otherwise, maintain dosing instructions for epinephrine use.  Counseled patient on potential for adverse effects with medications prescribed/recommended today, ER and return-to-clinic precautions discussed, patient verbalized understanding.    Jaynee Eagles, PA-C 05/09/22 1159

## 2022-05-09 NOTE — Telephone Encounter (Signed)
I called Laura Medina back.  She does note some sore throat and tongue swelling and irritation.  I think it is a good idea that she goes to the urgent care to have further evaluation.  She is able to speak clearly and does not sound as though she is in respiratory distress but further evaluation in an appropriate medical facility such as an urgent care is reasonable.  Expect that she probably will be given steroids and recommended that she not take sumatriptan again.  She is going to urgent care now.

## 2022-05-09 NOTE — ED Triage Notes (Signed)
Pt states allergic reaction after taking Imitrex last night. State her throat,tongue and lips feel swollen. States she took a Benadryl last night,nothing today.

## 2022-05-22 ENCOUNTER — Ambulatory Visit: Payer: Medicare HMO | Admitting: Physical Therapy

## 2022-05-22 ENCOUNTER — Encounter: Payer: Self-pay | Admitting: Physical Therapy

## 2022-05-22 DIAGNOSIS — G4486 Cervicogenic headache: Secondary | ICD-10-CM

## 2022-05-22 DIAGNOSIS — M542 Cervicalgia: Secondary | ICD-10-CM

## 2022-05-22 DIAGNOSIS — M6281 Muscle weakness (generalized): Secondary | ICD-10-CM

## 2022-05-22 DIAGNOSIS — R42 Dizziness and giddiness: Secondary | ICD-10-CM

## 2022-05-22 DIAGNOSIS — M25512 Pain in left shoulder: Secondary | ICD-10-CM

## 2022-05-22 NOTE — Therapy (Signed)
OUTPATIENT PHYSICAL THERAPY NEURO TREATMENT  PHYSICAL THERAPY DISCHARGE SUMMARY  Visits from Start of Care: 8  Current functional level related to goals / functional outcomes: All goals met   Remaining deficits: N/A   Education / Equipment: HEP   Patient agrees to discharge. Patient goals were met. Patient is being discharged due to being pleased with the current functional level.   Patient Name: Laura Medina MRN: [id number] DOB:1965-09-04, 56 y.o., female 38 Date: 05/22/2022   PCP: Gracy Bruins REFERRING PROVIDER: Sherene Sires   PT End of Session - 05/22/22 1338     PT Start Time 0100            Past Medical History:  Diagnosis Date   Arthritis    Fibromyalgia    Past Surgical History:  Procedure Laterality Date   blood clot evacuation     left leg   CHOLECYSTECTOMY     EXPLORATORY LAPAROTOMY     HEMORRHOID SURGERY     Patient Active Problem List   Diagnosis Date Noted   Rheumatoid arthritis of multiple sites without rheumatoid factor ([place]) 07/01/2016   High risk medication use 07/01/2016   Osteoarthritis, 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   Osteoarthritis, knee 07/01/2016   DDD (degenerative disc disease), lumbar 07/01/2016   Fibromyalgia 02/16/2013   Functional gait disorder 02/16/2013   Disturbance of skin sensation 02/16/2013   Pain in limb 02/16/2013    ONSET DATE: 01/15/22  REFERRING DIAG: [email or web address]  THERAPY DIAG:  Acute pain of left shoulder  Cervicalgia  Dizziness and giddiness  Muscle weakness (generalized)  Cervicogenic headache  Rationale for Evaluation and Treatment Rehabilitation  SUBJECTIVE:                                                                                                                                                                                              SUBJECTIVE STATEMENT: Patient reports that she is feeling much better overall. The DN was effective to relieve her tension in her neck.  PERTINENT HISTORY:  Fibromyalgia, arthritis   FALLS: Has patient fallen in last 6 months? 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 No acute intracranial abnormalities. No change from the recent studies.     IMPRESSION: No evidence of acute traumatic injury to the cervical spine.   Cervical spondylosis, as outlined. At C4-C5, a shallow disc bulge  mildly effaces the ventral thecal sac and may contact the ventral spinal cord. No significant spinal canal stenosis at the remaining levels. No significant foraminal stenosis. Mild multilevel disc degeneration. Minimal degenerative endplate edema at Y8-F0  COGNITION: Overall cognitive status: Within functional limits for tasks assessed   SENSATION: WFL  POSTURE: rounded shoulders, forward head, anterior pelvic tilt, and flexed trunk   LOWER EXTREMITY ROM:   WFL  LOWER EXTREMITY MMT:    MMT Right Eval Left Eval  Hip flexion 5 3+ w/pain  Hip extension    Hip abduction    Hip adduction    Hip internal rotation  4- w/pain  Hip external rotation  4- w/pain  Knee flexion 5 4- w/pain  Knee extension 5 4  Ankle dorsiflexion    Ankle plantarflexion    Ankle inversion    Ankle eversion    (Blank rows = not tested)  CERVICAL ROM:   Active ROM A/PROM (deg) eval 04/21/22  Flexion Pain at end range [facility]  Extension Mildly limitied with some pain  WFL  Right lateral flexion Mod tightness Mod tightness  Left lateral flexion Mod tightness w/pain Mod tightness  Right rotation 80d w/pain WFL no pain just tightness  Left rotation 80d  WFL    (Blank rows = not tested)  UPPER EXTREMITY ROM:  Active ROM Right eval Left eval  Shoulder flexion WNL 110 w/pain   Shoulder extension    Shoulder abduction WNL 94 w/pain  Shoulder adduction    Shoulder extension    Shoulder internal rotation [facility] WFL  Shoulder external rotation [facility] WFL  Elbow flexion WNL WNL  Elbow extension WNL WNL  Wrist flexion    Wrist extension    Wrist ulnar deviation    Wrist radial deviation    Wrist pronation    Wrist supination     (Blank rows = not tested)  UPPER EXTREMITY MMT:  MMT Right eval Left eval  Shoulder flexion 3+ w/pain 2+ w/pain  Shoulder extension    Shoulder abduction 3+ w/pain 2+ w/pain  Shoulder adduction    Shoulder extension    Shoulder internal rotation 4+ 3+w/pain  Shoulder external rotation 4+ 3+ w/pain  Middle trapezius    Lower trapezius    Elbow flexion 5 4+ w/pain  Elbow extension 5 4+ w/pain  Wrist flexion    Wrist extension    Wrist ulnar deviation    Wrist radial deviation    Wrist pronation    Wrist supination    Grip strength     (Blank rows = not tested)   Concussion Self-Reported Symptom Score Symptoms rated on a scale 1-6, in last 24 hours    Headache: 6 0   Nausea: 0 0  Dizziness: 4 0   Vomiting: 0 0  Balance Difficulty: 5 0  Trouble Falling Asleep: 6 1  Fatigue: 6 5  Sleep Less Than Usual: 6 5  Daytime Drowsiness: 4 4  Sleep More Than Usual: 0 0   Photophobia: 5 2  Phonophobia: 5 3  Irritability: 4 2  Sadness: 0  0  Numbness or Tingling: 6 6  Nervousness: 4 2  Feeling More Emotional: 4 0  Feeling Mentally Foggy: 5 2  Feeling Slowed Down: 6 2  Memory Problems: 5 5  Difficulty Concentrating: 5 4  Visual Problems: 5 5   Total # of Symptoms: 18/22 Total Symptom Score: 91/132, 48  GAIT: Gait pattern: step to pattern, decreased step length- Right, decreased step length- Left, decreased  stance time- Left, decreased stride length, antalgic, trendelenburg, wide BOS, poor foot clearance- Right, and poor foot clearance- Left Distance walked: 33f Assistive device utilized: None Level of assistance:  Complete Independence Comments: antalgic gait, slowed speed  FUNCTIONAL TESTs:  5 times sit to stand: 25.85s w/slight dizziness Timed up and go (TUG): 18.59s Berg Balance Scale: TBD  VESTIBULAR ASSESSMENT Horizontal and vertical saccades: cause dizziness VOR cancellation: x1 unable to maintain gaze after a few reps: dizziness. x2 double vision after 2 reps and dizziness  DVA: positive- 20/30 and 20/50 w/head turns Convergence: > 12in   PATIENT SURVEYS:  FOTO 43/100  TODAY'S TREATMENT:  05/22/22 NuStep L5 x 6 minutes Re-assess status for D/C.  05/08/22 NuStep L5 x 6 minutes STM to B up traps, rhomboids, medial scapular borders Active stretch to neck-UT, Medial scapular border, pects Shoulder ext, rows, ER against Green Tband resistance, 2 x 10 Additional STM to up traps, rhomboids, medial scapular border with deep pressure to knots DN by MRoma Schanz PT MH to neck and upper shoulders x 10 minutes  04/21/22 Nustep L5 x623ms 5xSTS and TUG  Concussion symptom score Shoulder flexion 2# WaTe 2x10 Rows and ext greenTB 2x10 ER/IR redTB LUE x10  ER with resistance yellow band x10 Horizontal ABD yellowTB  Red ball roll up wall x10  04/08/22 Assessed dynamic visual acuity, which was (-) Assessed alternating eye cover and eye cover, both were (-) VOR x2- Horizontal, started 1, completed 10 reps with dizziness up to 3, 2 attempts, both to 3. Saccades-horizontal, completed 4 reps before getting stuck on first attempt and second attempt Vertical, 5 reps before getting stuck on first attempt, 4 on second attempt. STM to B cerv paraspinals, L up traps, pects, F/B stretch Cervical traction 5 min at 5#, 5 min at  03/26/22 Horizontal and vertical saccades looking at two different targets  Head turns with eyes on target seated VOR x2- 3 reps before too dizzy to carry on, second round 5 reps before needing to stop  Convergence/divergence with letter 2x10 Rows and ext redTB  2x10 Cervical traction w/MH 1039m, 10lbs     03/24/22 Saccades-horizontal, completed 6 reps before getting stuck, but no increase in dizziness. Round 2, 2 x before getting stuck Vert 4 reps, Round 2, 3 sets. Convergence x 10 reps, tolerated well, no increased dizziness. Standing on Airex pad, normal BOS x 30 sec. Dizziness up to 7/10, resolved in 1 minute. Round 2, dizziness to 5. Round 3, with  narrow BOS, 6. STM to upper cervical paraspinals, Up traps, LS along with shoulder circles, 5 x in each direction.  03/13/22 Horizontal Saccades x 10- dizziness to 4/10, Increased difficulty with her eyes getting "stuck" and unable to shift back toward the end. Dizziness did not recede after 3 minutes, so further saccades deferred. Supine on mat, STM-Very gentle, to cerv paraspinals, up traps, LS, pects B, R tighter than L. Initiated gentle PROM to neck and upper trunk. Patient with limited tolerance, Increased pain, so moved to AA shoulder shrugs and depression, and scapular abd/add MH and IFC e stim to cervical and upper trunk, 12 minutes, to tolerance.  Horizontal and vertical saccades Convergence w/letter    PATIENT EDUCATION: Education details: POC Person educated: Patient Education method: Explanation Education comprehension: verbalized understanding   HOME EXERCISE PROGRAM: Access Code: [email or web address]: Goals reviewed with patient? Yes  SHORT TERM GOALS: Target date: 04/18/22  Patient will be independent with initial HEP.  Goal status: ongoing  2.   Patient will report 50% improvement in concussion and dizziness symptoms.  Goal status: met 04/21/22   LONG TERM GOALS: Target date: 05/30/22  Patient will be independent with advanced/ongoing HEP to improve outcomes and carryover.  Goal status:met  2.  Patient will report improvement in concussion and dizziness symptoms w/ <65 on concussion symptom score.  Baseline: 91, 48-9/25 Goal status: MET  3.  Patient will  demonstrate improved functional LE and UE strength as demonstrated by increase in 1 muscle grade. Goal status: 10/26-B shoulders at least 4/5, no pain.-met  4.  Patient to improve L shoulder and cervical AROM to [facility] without pain provocation to allow for increased ease of ADLs.  Goal status:met  5.  Patient will report 38 on FOTO to demonstrate improved functional ability.  Baseline: 43, 10/26- 56 Goal status: met  6.   Patient will demonstrate decreased fall risk by scoring < 15 sec on TUG and 5xSTS. Baseline: 18.59s, 25.85s 9/25- 12.19s and 13.33s  Goal status: Met 04/21/22   ASSESSMENT:  CLINICAL IMPRESSION: Patient reports continued improvement in all symptoms, no more head aches, dizziness, or pain in shoulders. Reviewed HEP and patient feels it is appropriate to her.  OBJECTIVE IMPAIRMENTS Abnormal gait, decreased activity tolerance, decreased balance, difficulty walking, decreased ROM, decreased strength, dizziness, impaired UE functional use, improper body mechanics, postural dysfunction, and pain.   ACTIVITY LIMITATIONS carrying, lifting, bending, squatting, sleeping, and locomotion level  PARTICIPATION LIMITATIONS: cleaning, laundry, shopping, community activity, and occupation  REHAB POTENTIAL: Good  CLINICAL DECISION MAKING: Evolving/moderate complexity  EVALUATION COMPLEXITY: Moderate  PLAN: PT FREQUENCY: 1-2x/week  PT DURATION: 12 weeks  PLANNED INTERVENTIONS: Therapeutic exercises, Therapeutic activity, Neuromuscular re-education, Balance training, Gait training, Patient/Family education, Self Care, Joint mobilization, Stair training, Vestibular training, Canalith repositioning, Dry Needling, Electrical stimulation, Cryotherapy, Moist heat, Vasopneumatic device, Traction, Ionotophoresis 11m/ml Dexamethasone, and Manual therapy  PLAN FOR NEXT SESSION:  Assess tolerance to DN., UE strengthening, balance tasks   SEthel RanaDPT  05/22/2022, 1:40 PM

## 2022-06-05 ENCOUNTER — Ambulatory Visit: Payer: Medicare HMO | Admitting: Physical Therapy

## 2022-06-12 ENCOUNTER — Ambulatory Visit (INDEPENDENT_AMBULATORY_CARE_PROVIDER_SITE_OTHER): Payer: Medicare HMO | Admitting: Family Medicine

## 2022-06-12 ENCOUNTER — Ambulatory Visit: Payer: Medicare HMO | Admitting: Physical Therapy

## 2022-06-12 VITALS — BP 112/76 | HR 90 | Ht 65.0 in | Wt 224.0 lb

## 2022-06-12 DIAGNOSIS — G43711 Chronic migraine without aura, intractable, with status migrainosus: Secondary | ICD-10-CM

## 2022-06-12 DIAGNOSIS — S060X9D Concussion with loss of consciousness of unspecified duration, subsequent encounter: Secondary | ICD-10-CM
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
## 2022-06-12 NOTE — Patient Instructions (Signed)
Thank you for coming in today.   Continue emgality.   Recheck as needed.   I am here for you when you need me.

## 2022-12-15 ENCOUNTER — Other Ambulatory Visit: Payer: Self-pay | Admitting: Family Medicine

## 2022-12-15 NOTE — Telephone Encounter (Signed)
Rx refill not appropriate. 

## 2023-01-11 ENCOUNTER — Other Ambulatory Visit: Payer: Self-pay | Admitting: Family Medicine

## 2023-01-12 NOTE — Telephone Encounter (Signed)
Last OV 06/12/22 Next OV not scheduled  Last refill 03/28/22

## 2023-05-02 ENCOUNTER — Emergency Department (HOSPITAL_BASED_OUTPATIENT_CLINIC_OR_DEPARTMENT_OTHER)
Admission: EM | Admit: 2023-05-02 | Discharge: 2023-05-02 | Disposition: A | Discharge status: Home / Self Care | Payer: Medicare HMO | Attending: Emergency Medicine | Admitting: Emergency Medicine

## 2023-05-02 ENCOUNTER — Emergency Department (HOSPITAL_BASED_OUTPATIENT_CLINIC_OR_DEPARTMENT_OTHER): Payer: Medicare HMO

## 2023-05-02 ENCOUNTER — Encounter (HOSPITAL_BASED_OUTPATIENT_CLINIC_OR_DEPARTMENT_OTHER): Payer: Self-pay | Admitting: Emergency Medicine

## 2023-05-02 DIAGNOSIS — I10 Essential (primary) hypertension: Secondary | ICD-10-CM | POA: Diagnosis not present

## 2023-05-02 DIAGNOSIS — Z79899 Other long term (current) drug therapy: Secondary | ICD-10-CM | POA: Insufficient documentation

## 2023-05-02 DIAGNOSIS — R0789 Other chest pain: Secondary | ICD-10-CM | POA: Insufficient documentation

## 2023-05-02 DIAGNOSIS — R079 Chest pain, unspecified: Secondary | ICD-10-CM | POA: Diagnosis present

## 2023-05-02 LAB — BASIC METABOLIC PANEL
Anion gap: 9 (ref 5–15)
BUN: 14 mg/dL (ref 6–20)
CO2: 26 mmol/L (ref 22–32)
Calcium: 9.3 mg/dL (ref 8.9–10.3)
Chloride: 102 mmol/L (ref 98–111)
Creatinine, Ser: 0.98 mg/dL (ref 0.44–1.00)
GFR, Estimated: >60 mL/min (ref >60)
Glucose, Bld: 96 mg/dL (ref 70–99)
Potassium: 3.3 mmol/L — ABNORMAL LOW (ref 3.5–5.1)
Sodium: 137 mmol/L (ref 135–145)

## 2023-05-02 LAB — CBC WITH DIFFERENTIAL/PLATELET
Abs Immature Granulocytes: 0.02 10*3/uL (ref 0.00–0.07)
Basophils Absolute: 0 10*3/uL (ref 0.0–0.1)
Basophils Relative: 0 %
Eosinophils Absolute: 0.1 10*3/uL (ref 0.0–0.5)
Eosinophils Relative: 2 %
HCT: 41.1 % (ref 36.0–46.0)
Hemoglobin: 13.9 g/dL (ref 12.0–15.0)
Immature Granulocytes: 0 %
Lymphocytes Relative: 43 %
Lymphs Abs: 1.9 10*3/uL (ref 0.7–4.0)
MCH: 32.3 pg (ref 26.0–34.0)
MCHC: 33.8 g/dL (ref 30.0–36.0)
MCV: 95.4 fL (ref 80.0–100.0)
Monocytes Absolute: 0.5 10*3/uL (ref 0.1–1.0)
Monocytes Relative: 11 %
Neutro Abs: 2 10*3/uL (ref 1.7–7.7)
Neutrophils Relative %: 44 %
Platelets: 218 10*3/uL (ref 150–400)
RBC: 4.31 MIL/uL (ref 3.87–5.11)
RDW: 13.2 % (ref 11.5–15.5)
WBC: 4.5 10*3/uL (ref 4.0–10.5)
nRBC: 0 % (ref 0.0–0.2)

## 2023-05-02 LAB — TROPONIN I (HIGH SENSITIVITY)
Troponin I (High Sensitivity): 3 ng/L (ref <18)
Troponin I (High Sensitivity): 4 ng/L (ref <18)

## 2023-05-02 MED ORDER — IOHEXOL 350 MG/ML SOLN
75.0000 mL | Freq: Once | INTRAVENOUS | Status: AC | PRN
  Administered 2023-05-02: 75 mL via INTRAVENOUS

## 2023-05-02 MED ORDER — ASPIRIN 81 MG PO CHEW
324.0000 mg | CHEWABLE_TABLET | Freq: Once | ORAL | Status: AC
  Administered 2023-05-02: 324 mg via ORAL
  Filled 2023-05-02: qty 4

## 2023-05-02 MED ORDER — FENTANYL CITRATE PF 50 MCG/ML IJ SOSY
50.0000 ug | PREFILLED_SYRINGE | Freq: Once | INTRAMUSCULAR | Status: AC
  Administered 2023-05-02: 50 ug via INTRAVENOUS
  Filled 2023-05-02: qty 1

## 2023-05-02 NOTE — ED Triage Notes (Signed)
Pt c/o CP (bil upper), describes as heaviness, since yesterday; denies other sxs

## 2023-05-02 NOTE — ED Notes (Signed)
Pt transported to imaging.

## 2023-05-02 NOTE — ED Provider Notes (Signed)
[facility] Provider Note   CSN: [id number] Arrival date & time: 05/02/23  1712     History {Add pertinent medical, surgical, social history, OB history to HPI:1} Chief Complaint  Patient presents with   Chest Pain    Laura Medina is a 57 y.o. female.  She has a history of high blood pressure, fibromyalgia.  She has had some heaviness and pain in her chest it has been going on since yesterday.  She said it feels like somebody sitting on her.  She rates it as 9 out of 10.  She tried to ignore yesterday but was still very today so decided to get evaluated.  She said she has never had it before.  Causes her to feel little short of breath but does not hurt too painful.  No nausea vomiting or dizziness diaphoresis.  No known history of chest pain.  She does say she had a hematoma passed on her leg when she was young but no blood clot.  Not on any blood thinners.  She has tried nothing for her symptoms.  The history is provided by the patient.  Chest Pain Pain location:  Substernal area Pain quality: pressure   Pain radiates to:  Does not radiate Pain severity:  Severe Onset quality:  Gradual Duration:  2 days Timing:  Constant Progression:  Unchanged Chronicity:  New Relieved by:  None tried Worsened by:  Nothing Ineffective treatments:  None tried Associated symptoms: no cough, no diaphoresis, no dizziness, no fever, no nausea, no palpitations, no shortness of breath and no vomiting   Risk factors: hypertension   Risk factors: no aortic disease, no coronary artery disease, no diabetes mellitus, no high cholesterol, no immobilization and no smoking        Home Medications Prior to Admission medications   Medication Sig Start Date End Date Taking? Authorizing Provider  amLODipine (NORVASC) 10 MG tablet TAKE 1 TABLET(10 MG) BY MOUTH DAILY 03/01/19   [provider]  augmented betamethasone dipropionate (DIPROLENE-AF) 0.05 %  ointment Apply to scalp 3-4 times per week 11/27/16   [provider]  Azelastine HCl 0.15 % SOLN  07/01/16   [provider]  Capsaicin 0.1 % CREA Apply to bilateral knees every 8 hours as needed. 02/05/21   [provider]  carbamide peroxide (DEBROX) 6.5 % OTIC solution Place 5 drops into both ears 2 (two) times daily. 12/17/21   Wallis Bamberg, PA-C  cetirizine (ZYRTEC ALLERGY) 10 MG tablet Take 1 tablet (10 mg total) by mouth daily. 12/17/21   Wallis Bamberg, PA-C  cholecalciferol (VITAMIN D) 1000 units tablet Take 1,000 Units by mouth daily.    [provider]  Cholecalciferol 1.25 MG (50000 UT) capsule TAKE 1 CAPSULE BY MOUTH WEEKLY FOR 3 MONTHS 04/08/16   [provider]  EPINEPHRINE 0.3 mg/0.3 mL IJ SOAJ injection INJECT 0.3 MG INTO THE MUSCLE AS NEEDED FOR ANAPHYLAXIS. 01/12/23   Rodolph Bong, MD  famotidine (PEPCID) 20 MG tablet Take 1 tablet (20 mg total) by mouth 2 (two) times daily. 12/17/21   Wallis Bamberg, PA-C  finasteride (PROSCAR) 5 MG tablet Take 5 mg by mouth daily. 11/18/21   [provider]  Galcanezumab-gnlm (EMGALITY) 120 MG/ML SOAJ Inject 120 mg into the skin every 30 (thirty) days. After the first dose 02/20/22   Rodolph Bong, MD  linaclotide Karlene Einstein) 145 MCG CAPS capsule Take by mouth. 04/25/21   [provider]  RESTASIS 0.05 %  ophthalmic emulsion  04/28/16   [provider]  valACYclovir (VALTREX) 1000 MG tablet  04/08/16   [provider]      Allergies    Celecoxib, Duloxetine, Hydrocodone-acetaminophen, Hydroxychloroquine, Oxycodone-acetaminophen, Sumatriptan, Methotrexate, Buprenorphine, Gabapentin, Lyrica [pregabalin], Morphine, Nucynta [tapentadol], Quinine derivatives, Sulfur, Topamax [topiramate], Acetaminophen-codeine, Hydrocodone, Oxycodone, and Penicillins    Review of Systems   Review of Systems  Constitutional:  Negative for diaphoresis and fever.  Eyes:  Negative for visual disturbance.   Respiratory:  Negative for cough and shortness of breath.   Cardiovascular:  Positive for chest pain. Negative for palpitations.  Gastrointestinal:  Negative for nausea and vomiting.  Neurological:  Negative for dizziness.    Physical Exam Updated Vital Signs BP 135/84   Pulse 91   Temp 98 F (36.7 C) (Oral)   Resp 16   Ht 5' 4.5" (1.638 m)   Wt 103.4 kg   LMP 10/27/2016 (Exact Date)   SpO2 100%   BMI 38.53 kg/m  Physical Exam Vitals and nursing note reviewed.  Constitutional:      General: She is not in acute distress.    Appearance: She is well-developed.  HENT:     Head: Normocephalic and atraumatic.  Eyes:     Conjunctiva/sclera: Conjunctivae normal.  Cardiovascular:     Rate and Rhythm: Normal rate and regular rhythm.     Heart sounds: Normal heart sounds. No murmur heard. Pulmonary:     Effort: Pulmonary effort is normal. No respiratory distress.     Breath sounds: Normal breath sounds.  Abdominal:     Palpations: Abdomen is soft.     Tenderness: There is no abdominal tenderness.  Musculoskeletal:        General: No swelling. Normal range of motion.     Cervical back: Neck supple.     Right lower leg: No tenderness. No edema.     Left lower leg: No tenderness. No edema.  Skin:    General: Skin is warm and dry.     Capillary Refill: Capillary refill takes less than 2 seconds.  Neurological:     General: No focal deficit present.     Mental Status: She is alert.     ED Results / Procedures / Treatments   Labs (all labs ordered are listed, but only abnormal results are displayed) Labs Reviewed  BASIC METABOLIC PANEL - Abnormal; Notable for the following components:      Result Value   Potassium 3.3 (*)    All other components within normal limits  CBC WITH DIFFERENTIAL/PLATELET  TROPONIN I (HIGH SENSITIVITY)    EKG EKG Interpretation Date/Time:  Saturday May 02 2023 17:21:26 EDT Ventricular Rate:  90 PR Interval:  144 QRS Duration:  83 QT  Interval:  332 QTC Calculation: 407 R Axis:   48  Text Interpretation: Sinus rhythm Atrial premature complex No significant change since prior 6/23 Confirmed by Meridee Score [id number]) on 05/02/2023 5:33:33 PM  Radiology No results found.  Procedures Procedures  {Document cardiac monitor, telemetry assessment procedure when appropriate:1}  Medications Ordered in ED Medications  aspirin chewable tablet 324 mg (has no administration in time range)    ED Course/ Medical Decision Making/ A&P   {   Click here for ABCD2, HEART and other calculatorsREFRESH Note before signing :1}                              Medical Decision Making Amount and/or  Complexity of Data Reviewed Labs: ordered. Radiology: ordered.  Risk OTC drugs.   This patient complains of ***; this involves an extensive number of treatment Options and is a complaint that carries with it a high risk of complications and morbidity. The differential includes ***  I ordered, reviewed and interpreted labs, which included *** I ordered medication *** and reviewed PMP when indicated. I ordered imaging studies which included *** and I independently    visualized and interpreted imaging which showed *** Additional history obtained from *** Previous records obtained and reviewed *** I consulted *** and discussed lab and imaging findings and discussed disposition.  Cardiac monitoring reviewed, *** Social determinants considered, *** Critical Interventions: ***  After the interventions stated above, I reevaluated the patient and found *** Admission and further testing considered, ***   {Document critical care time when appropriate:1} {Document review of labs and clinical decision tools ie heart score, Chads2Vasc2 etc:1}  {Document your independent review of radiology images, and any outside records:1} {Document your discussion with family members, caretakers, and with consultants:1} {Document social determinants of  health affecting pt's care:1} {Document your decision making why or why not admission, treatments were needed:1} Final Clinical Impression(s) / ED Diagnoses Final diagnoses:  None    Rx / DC Orders ED Discharge Orders     None

## 2023-05-02 NOTE — Discharge Instructions (Signed)
You were seen by emergency department for chest pain and shortness of breath.  He had blood work EKG and chest x-ray that did not show any evidence of heart injury or other obvious explanation for your symptoms.  Please continue your regular medication and follow-up with your primary care doctor.  They may want to refer you on to cardiology for further testing.  Return to the emergency department if any worsening or concerning symptoms

## 2023-05-02 NOTE — ED Notes (Signed)
D/c paperwork reviewed with pt, including follow up care.  No questions or concerns voiced at time of d/c. Laura Medina Pt verbalized understanding, Ambulatory with family to ED exit, NAD.
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
## 2024-05-31 ENCOUNTER — Other Ambulatory Visit: Payer: Self-pay

## 2024-05-31 ENCOUNTER — Encounter (HOSPITAL_BASED_OUTPATIENT_CLINIC_OR_DEPARTMENT_OTHER): Payer: Self-pay | Admitting: Orthopaedic Surgery

## 2024-06-02 ENCOUNTER — Encounter (HOSPITAL_BASED_OUTPATIENT_CLINIC_OR_DEPARTMENT_OTHER)
Admission: RE | Admit: 2024-06-02 | Discharge: 2024-06-02 | Disposition: A | Discharge status: Home / Self Care | Source: Ambulatory Visit | Attending: Orthopaedic Surgery | Admitting: Orthopaedic Surgery

## 2024-06-02 DIAGNOSIS — Z01818 Encounter for other preprocedural examination: Secondary | ICD-10-CM | POA: Insufficient documentation

## 2024-06-06 NOTE — H&P (Signed)
 ORTHOPAEDIC SURGERY H&P  Subjective:  The patient presents for left plantar fasciectomy and possible gastrocnemius recession vs tendoachilles lengthening, left posterior tibialis tendon debridement and possible repair.   Past Medical History:  Diagnosis Date   Arthritis    Fibromyalgia    GERD (gastroesophageal reflux disease)    Hypertension     Past Surgical History:  Procedure Laterality Date   blood clot evacuation     left leg   CHOLECYSTECTOMY     EXPLORATORY LAPAROTOMY     HEMORRHOID SURGERY       (Not in an outpatient encounter)    Allergies  Allergen Reactions   Celecoxib Diarrhea and Itching   Duloxetine Itching   Hydrocodone-Acetaminophen  Itching   Hydroxychloroquine Itching   Oxycodone-Acetaminophen  Itching   Sumatriptan  Swelling    Tongue and throat swelling.   Methotrexate Rash   Buprenorphine     itching   Gabapentin Itching   Lyrica  [Pregabalin ]     itching   Morphine Other (See Comments)    unknown   Nucynta [Tapentadol]     itching   Quinine Derivatives Itching   Sulfur      Burning sensation   Topamax [Topiramate] Itching and Swelling   Acetaminophen -Codeine Itching   Hydrocodone Itching   Oxycodone Itching   Penicillins Itching    Social History   Socioeconomic History   Marital status: Divorced    Spouse name: Not on file   Number of children: Not on file   Years of education: Not on file   Highest education level: Not on file  Occupational History   Not on file  Tobacco Use   Smoking status: Never   Smokeless tobacco: Never  Vaping Use   Vaping status: Never Used  Substance and Sexual Activity   Alcohol use: No   Drug use: No   Sexual activity: Not Currently    Birth control/protection: None, Post-menopausal  Other Topics Concern   Not on file  Social History Narrative   Not on file   Social Drivers of Health   Financial Resource Strain: Not on file  Food Insecurity: Low Risk  (01/15/2023)   Received from [facility]   Hunger Vital Sign    Within the past 12 months, you worried that your food would run out before you got money to buy more: Never true    Within the past 12 months, the food you bought just didn't last and you didn't have money to get more. : Never true  Transportation Needs: Not on file (01/15/2023)  Physical Activity: Not on file  Stress: Not on file  Social Connections: Not on file  Intimate Partner Violence: Not At Risk (03/01/2019)   Received from [facility]   Humiliation, Afraid, Rape, and Kick questionnaire    Within the last year, have you been afraid of your partner or ex-partner?: No    Within the last year, have you been humiliated or emotionally abused in other ways by your partner or ex-partner?: No    Within the last year, have you been kicked, hit, slapped, or otherwise physically hurt by your partner or ex-partner?: No    Within the last year, have you been raped or forced to have any kind of sexual activity by your partner or ex-partner?: No     History reviewed. No pertinent family history.   Review of Systems Pertinent items are noted in HPI.  Objective: Vital signs in last 24 hours:    05/31/2024  4:39 PM 05/02/2023    8:00 PM 05/02/2023    7:30 PM  Vitals with BMI  Height 5' 4    Weight 228 lbs    BMI 39.12    Systolic  118 116  Diastolic  66 76  Pulse  84 82      EXAM: General: Well nourished, well developed. Awake, alert and oriented to time, place, person. Normal mood and affect. No apparent distress. Breathing room air.  Operative Lower Extremity: Alignment - Neutral Deformity - None Skin intact Tenderness to palpation - PF 5/5 TA, PT, GS, Per, EHL, FHL Sensation intact to light touch throughout Palpable DP and PT pulses Special testing: None  The contralateral foot/ankle was examined for comparison and noted to be neurovascularly intact with no localized deformity, swelling, or tenderness.  Imaging Review All images taken  were independently reviewed by me.  Assessment/Plan: The clinical and radiographic findings were reviewed and discussed at length with the patient.  The patient presents for left plantar fasciectomy and possible gastrocnemius recession vs tendoachilles lengthening, left posterior tibialis tendon debridement and possible repair.  We spoke at length about the natural course of these findings. We discussed nonoperative and operative treatment options in detail.  The risks and benefits were presented and reviewed. The risks due to suture/hardware failure/irritation (or if removing hardware inability to remove part/all of hardware, recurrent instability), new/persistent/recurrent infection, stiffness, nerve/vessel/tendon injury, nonunion/malunion of any fracture, wound healing issues, allograft usage, development of arthritis, failure of this surgery, possibility of external fixation in certain situations, possibility of delayed definitive surgery, need for further surgery, prolonged wound care including further soft tissue coverage procedures, thromboembolic events, anesthesia/medical complications/events perioperatively and beyond, amputation, death among others were discussed. The patient acknowledged the explanation and agreed to proceed with the plan.  Laura Medina  Orthopaedic Surgery [facility]

## 2024-06-06 NOTE — Discharge Instructions (Signed)
 Lillia Mountain, MD [facility]  Please read the following information regarding your care after surgery.  Medications   - Oxycodone 5 mg every 4-6 hours as needed for pain - Aspirin  81 mg twice daily as scheduled to prevent blood clots - Colace 100 mg twice daily as needed for constipation - Zofran  4 mg every 8 hours as needed for nausea/vomiting  We send above prescriptions to your pharmacy on file.  In addition you may also use: ? acetaminophen  (Tylenol ) 500 mg every 4-6 hours as you need for minor to moderate pain  Resume all other routine medications per usual or as directed by your PCP/other specialists.  Weight Bearing ? Do NOT bear any weight on the operated leg or foot. This means do NOT touch your surgical leg to the ground!  Cast / Splint / Dressing ? If you have a splint, do NOT remove this. Keep your splint, cast or dressing clean and dry.  Don't put anything (coat hanger, pencil, etc) down inside of it.  If it gets wet, call the office immediately to schedule an appointment for a cast change.  Swelling IMPORTANT: It is normal for you to have swelling where you had surgery. To reduce swelling and pain, keep at least 3 pillows under your leg so that your toes are above your nose and your heel is above the level of your hip.  It may be necessary to keep your foot or leg elevated for several weeks.  This is critical to helping your incisions heal and your pain to feel better.  Follow Up Call my office at [phone] when you are discharged from the hospital or surgery center to schedule an appointment to be seen 7-10 days after surgery.  Call my office at [phone] if you develop a fever >101.5 F, nausea, vomiting, bleeding from the surgical site or severe pain.     Post Anesthesia Home Care Instructions  Activity: Get plenty of rest for the remainder of the day. A responsible individual must stay with you for 24 hours following the procedure.  For the next 24  hours, DO NOT: -Drive a car -Advertising copywriter -Drink alcoholic beverages -Take any medication unless instructed by your physician -Make any legal decisions or sign important papers.  Meals: Start with liquid foods such as gelatin or soup. Progress to regular foods as tolerated. Avoid greasy, spicy, heavy foods. If nausea and/or vomiting occur, drink only clear liquids until the nausea and/or vomiting subsides. Call your physician if vomiting continues.  Special Instructions/Symptoms: Your throat may feel dry or sore from the anesthesia or the breathing tube placed in your throat during surgery. If this causes discomfort, gargle with warm salt water. The discomfort should disappear within 24 hours.  If you had a scopolamine patch placed behind your ear for the management of post- operative nausea and/or vomiting:  1. The medication in the patch is effective for 72 hours, after which it should be removed.  Wrap patch in a tissue and discard in the trash. Wash hands thoroughly with soap and water. 2. You may remove the patch earlier than 72 hours if you experience unpleasant side effects which may include dry mouth, dizziness or visual disturbances. 3. Avoid touching the patch. Wash your hands with soap and water after contact with the patch.    Regional Anesthesia Blocks  1. You may not be able to move or feel the blocked extremity after a regional anesthetic block. This may last may last from 3-48 hours after  placement, but it will go away. The length of time depends on the medication injected and your individual response to the medication. As the nerves start to wake up, you may experience tingling as the movement and feeling returns to your extremity. If the numbness and inability to move your extremity has not gone away after 48 hours, please call your surgeon.   2. The extremity that is blocked will need to be protected until the numbness is gone and the strength has returned. Because  you cannot feel it, you will need to take extra care to avoid injury. Because it may be weak, you may have difficulty moving it or using it. You may not know what position it is in without looking at it while the block is in effect.  3. For blocks in the legs and feet, returning to weight bearing and walking needs to be done carefully. You will need to wait until the numbness is entirely gone and the strength has returned. You should be able to move your leg and foot normally before you try and bear weight or walk. You will need someone to be with you when you first try to ensure you do not fall and possibly risk injury.  4. Bruising and tenderness at the needle site are common side effects and will resolve in a few days.  5. Persistent numbness or new problems with movement should be communicated to the surgeon or the [facility] [phone] [facility] [phone]).  May have Tylenol  today 2:45pm

## 2024-06-08 ENCOUNTER — Other Ambulatory Visit: Payer: Self-pay

## 2024-06-08 ENCOUNTER — Encounter (HOSPITAL_BASED_OUTPATIENT_CLINIC_OR_DEPARTMENT_OTHER): Payer: Self-pay | Admitting: Orthopaedic Surgery

## 2024-06-08 ENCOUNTER — Ambulatory Visit (HOSPITAL_BASED_OUTPATIENT_CLINIC_OR_DEPARTMENT_OTHER)
Admission: RE | Admit: 2024-06-08 | Discharge: 2024-06-08 | Disposition: A | Discharge status: Home / Self Care | Attending: Orthopaedic Surgery | Admitting: Orthopaedic Surgery

## 2024-06-08 ENCOUNTER — Encounter (HOSPITAL_BASED_OUTPATIENT_CLINIC_OR_DEPARTMENT_OTHER)
Admission: RE | Disposition: A | Discharge status: Home / Self Care | Payer: Self-pay | Source: Home / Self Care | Attending: Orthopaedic Surgery

## 2024-06-08 ENCOUNTER — Ambulatory Visit (HOSPITAL_BASED_OUTPATIENT_CLINIC_OR_DEPARTMENT_OTHER): Payer: Self-pay | Admitting: Anesthesiology

## 2024-06-08 DIAGNOSIS — Z6839 Body mass index (BMI) 39.0-39.9, adult: Secondary | ICD-10-CM | POA: Insufficient documentation

## 2024-06-08 DIAGNOSIS — E669 Obesity, unspecified: Secondary | ICD-10-CM | POA: Insufficient documentation

## 2024-06-08 DIAGNOSIS — M797 Fibromyalgia: Secondary | ICD-10-CM | POA: Insufficient documentation

## 2024-06-08 DIAGNOSIS — M62462 Contracture of muscle, left lower leg: Secondary | ICD-10-CM | POA: Insufficient documentation

## 2024-06-08 DIAGNOSIS — I10 Essential (primary) hypertension: Secondary | ICD-10-CM | POA: Diagnosis not present

## 2024-06-08 DIAGNOSIS — K219 Gastro-esophageal reflux disease without esophagitis: Secondary | ICD-10-CM | POA: Insufficient documentation

## 2024-06-08 DIAGNOSIS — M722 Plantar fascial fibromatosis: Secondary | ICD-10-CM

## 2024-06-08 DIAGNOSIS — M1990 Unspecified osteoarthritis, unspecified site: Secondary | ICD-10-CM | POA: Diagnosis not present

## 2024-06-08 HISTORY — DX: Essential (primary) hypertension: I10

## 2024-06-08 HISTORY — DX: Gastro-esophageal reflux disease without esophagitis: K21.9

## 2024-06-08 SURGERY — FASCIECTOMY, PLANTAR
Anesthesia: General | Site: Leg Lower | Laterality: Left

## 2024-06-08 MED ORDER — CEFAZOLIN SODIUM-DEXTROSE 2-4 GM/100ML-% IV SOLN
2.0000 g | INTRAVENOUS | Status: AC
  Administered 2024-06-08: 2 g via INTRAVENOUS

## 2024-06-08 MED ORDER — LIDOCAINE HCL (CARDIAC) PF 100 MG/5ML IV SOSY
PREFILLED_SYRINGE | INTRAVENOUS | Status: DC | PRN
  Administered 2024-06-08: 100 mg via INTRAVENOUS

## 2024-06-08 MED ORDER — VANCOMYCIN HCL 500 MG IV SOLR
INTRAVENOUS | Status: DC | PRN
  Administered 2024-06-08: 500 mg via TOPICAL

## 2024-06-08 MED ORDER — FENTANYL CITRATE (PF) 100 MCG/2ML IJ SOLN
INTRAMUSCULAR | Status: AC
  Filled 2024-06-08: qty 2

## 2024-06-08 MED ORDER — POVIDONE-IODINE 10 % EX SOLN
CUTANEOUS | Status: DC | PRN
  Administered 2024-06-08: 1 via TOPICAL

## 2024-06-08 MED ORDER — ONDANSETRON HCL 4 MG/2ML IJ SOLN
INTRAMUSCULAR | Status: DC | PRN
  Administered 2024-06-08: 4 mg via INTRAVENOUS

## 2024-06-08 MED ORDER — ASPIRIN 81 MG PO TBEC: 81.0000 mg | DELAYED_RELEASE_TABLET | Freq: Two times a day (BID) | ORAL | 0 refills | Status: AC

## 2024-06-08 MED ORDER — DIPHENHYDRAMINE HCL 50 MG/ML IJ SOLN
25.0000 mg | Freq: Once | INTRAMUSCULAR | Status: AC
  Administered 2024-06-08: 25 mg via INTRAVENOUS

## 2024-06-08 MED ORDER — PROPOFOL 10 MG/ML IV BOLUS
INTRAVENOUS | Status: DC | PRN
  Administered 2024-06-08: 50 mg via INTRAVENOUS
  Administered 2024-06-08: 200 mg via INTRAVENOUS

## 2024-06-08 MED ORDER — DOCUSATE SODIUM 100 MG PO CAPS: 100.0000 mg | ORAL_CAPSULE | Freq: Two times a day (BID) | ORAL | 0 refills | Status: AC

## 2024-06-08 MED ORDER — FENTANYL CITRATE (PF) 100 MCG/2ML IJ SOLN
50.0000 ug | Freq: Once | INTRAMUSCULAR | Status: AC
  Administered 2024-06-08: 50 ug via INTRAVENOUS

## 2024-06-08 MED ORDER — ONDANSETRON HCL 4 MG/2ML IJ SOLN: 4.0000 mg | Freq: Once | INTRAMUSCULAR | Status: DC | PRN

## 2024-06-08 MED ORDER — DIPHENHYDRAMINE HCL 50 MG/ML IJ SOLN
INTRAMUSCULAR | Status: AC
Start: 2024-06-08 — End: 2024-06-08
  Filled 2024-06-08: qty 1

## 2024-06-08 MED ORDER — KETOROLAC TROMETHAMINE 30 MG/ML IJ SOLN
INTRAMUSCULAR | Status: AC
  Filled 2024-06-08: qty 1

## 2024-06-08 MED ORDER — ONDANSETRON 4 MG PO TBDP: 4.0000 mg | ORAL_TABLET | Freq: Three times a day (TID) | ORAL | 0 refills | Status: AC | PRN

## 2024-06-08 MED ORDER — OXYCODONE HCL 5 MG PO TABS: 5.0000 mg | ORAL_TABLET | ORAL | 0 refills | Status: AC | PRN

## 2024-06-08 MED ORDER — MIDAZOLAM HCL 2 MG/2ML IJ SOLN
INTRAMUSCULAR | Status: AC
  Filled 2024-06-08: qty 2

## 2024-06-08 MED ORDER — AMISULPRIDE (ANTIEMETIC) 5 MG/2ML IV SOLN: 10.0000 mg | Freq: Once | INTRAVENOUS | Status: DC | PRN

## 2024-06-08 MED ORDER — LACTATED RINGERS IV SOLN: INTRAVENOUS | Status: DC

## 2024-06-08 MED ORDER — ACETAMINOPHEN 500 MG PO TABS
ORAL_TABLET | ORAL | Status: AC
Start: 2024-06-08 — End: 2024-06-08
  Filled 2024-06-08: qty 2

## 2024-06-08 MED ORDER — BUPIVACAINE-EPINEPHRINE (PF) 0.5% -1:200000 IJ SOLN
INTRAMUSCULAR | Status: DC | PRN
  Administered 2024-06-08: 30 mL via PERINEURAL
  Administered 2024-06-08: 10 mL via PERINEURAL

## 2024-06-08 MED ORDER — CEFAZOLIN SODIUM-DEXTROSE 2-4 GM/100ML-% IV SOLN
INTRAVENOUS | Status: AC
  Filled 2024-06-08: qty 100

## 2024-06-08 MED ORDER — FENTANYL CITRATE (PF) 100 MCG/2ML IJ SOLN
INTRAMUSCULAR | Status: DC | PRN
  Administered 2024-06-08: 50 ug via INTRAVENOUS
  Administered 2024-06-08 (×2): 25 ug via INTRAVENOUS

## 2024-06-08 MED ORDER — 0.9 % SODIUM CHLORIDE (POUR BTL) OPTIME
TOPICAL | Status: DC | PRN
  Administered 2024-06-08: 1000 mL

## 2024-06-08 MED ORDER — KETOROLAC TROMETHAMINE 30 MG/ML IJ SOLN
30.0000 mg | Freq: Once | INTRAMUSCULAR | Status: AC
  Administered 2024-06-08: 30 mg via INTRAVENOUS

## 2024-06-08 MED ORDER — MIDAZOLAM HCL (PF) 2 MG/2ML IJ SOLN
2.0000 mg | Freq: Once | INTRAMUSCULAR | Status: AC
  Administered 2024-06-08: 2 mg via INTRAVENOUS

## 2024-06-08 MED ORDER — CHLORHEXIDINE GLUCONATE 4 % EX SOLN: 60.0000 mL | Freq: Once | CUTANEOUS | Status: DC

## 2024-06-08 MED ORDER — ACETAMINOPHEN 500 MG PO TABS
1000.0000 mg | ORAL_TABLET | Freq: Once | ORAL | Status: AC
  Administered 2024-06-08: 1000 mg via ORAL

## 2024-06-08 MED ORDER — TRAMADOL HCL 50 MG PO TABS
50.0000 mg | ORAL_TABLET | Freq: Once | ORAL | Status: AC
Start: 2024-06-08 — End: 2024-06-08
  Administered 2024-06-08: 50 mg via ORAL

## 2024-06-08 MED ORDER — FENTANYL CITRATE (PF) 100 MCG/2ML IJ SOLN
25.0000 ug | INTRAMUSCULAR | Status: DC | PRN
  Administered 2024-06-08: 50 ug via INTRAVENOUS

## 2024-06-08 MED ORDER — SODIUM CHLORIDE 0.9 % IV SOLN: INTRAVENOUS | Status: DC

## 2024-06-08 MED ORDER — TRAMADOL HCL 50 MG PO TABS
ORAL_TABLET | ORAL | Status: AC
  Filled 2024-06-08: qty 1

## 2024-06-08 SURGICAL SUPPLY — 52 items
BLADE SURG 15 STRL LF DISP TIS (BLADE) ×1 IMPLANT
BNDG COMPR ESMARK 6X3 LF (GAUZE/BANDAGES/DRESSINGS) IMPLANT
BNDG ELASTIC 4INX 5YD STR LF (GAUZE/BANDAGES/DRESSINGS) ×1 IMPLANT
BNDG ELASTIC 6INX 5YD STR LF (GAUZE/BANDAGES/DRESSINGS) IMPLANT
BNDG ELASTIC 6X10 VLCR STRL LF (GAUZE/BANDAGES/DRESSINGS) IMPLANT
CHLORAPREP W/TINT 26 (MISCELLANEOUS) ×1 IMPLANT
COVER BACK TABLE 60X90IN (DRAPES) ×1 IMPLANT
CUFF TRNQT CYL 34X4.125X (TOURNIQUET CUFF) IMPLANT
DRAPE EXTREMITY T 121X128X90 (DISPOSABLE) ×1 IMPLANT
DRAPE U-SHAPE 47X51 STRL (DRAPES) ×1 IMPLANT
DRSG MEPITEL 4X7.2 (GAUZE/BANDAGES/DRESSINGS) IMPLANT
ELECTRODE REM PT RTRN 9FT ADLT (ELECTROSURGICAL) ×1 IMPLANT
GAUZE PAD ABD 8X10 STRL (GAUZE/BANDAGES/DRESSINGS) ×1 IMPLANT
GAUZE SPONGE 4X4 12PLY STRL (GAUZE/BANDAGES/DRESSINGS) ×1 IMPLANT
GLOVE BIO SURGEON STRL SZ8 (GLOVE) ×1 IMPLANT
GLOVE BIOGEL PI IND STRL 8 (GLOVE) ×2 IMPLANT
GLOVE ECLIPSE 8.0 STRL XLNG CF (GLOVE) ×1 IMPLANT
GLOVE SURG SS PI 7.5 STRL IVOR (GLOVE) IMPLANT
GOWN STRL REUS W/ TWL LRG LVL3 (GOWN DISPOSABLE) ×1 IMPLANT
GOWN STRL REUS W/ TWL XL LVL3 (GOWN DISPOSABLE) ×2 IMPLANT
NDL HYPO 22X1.5 SAFETY MO (MISCELLANEOUS) IMPLANT
NDL HYPO 25X1 1.5 SAFETY (NEEDLE) IMPLANT
NEEDLE HYPO 22X1.5 SAFETY MO (MISCELLANEOUS) IMPLANT
NEEDLE HYPO 25X1 1.5 SAFETY (NEEDLE) IMPLANT
PACK BASIN DAY SURGERY FS (CUSTOM PROCEDURE TRAY) ×1 IMPLANT
PAD CAST 4YDX4 CTTN HI CHSV (CAST SUPPLIES) ×1 IMPLANT
PADDING CAST ABS COTTON 4X4 ST (CAST SUPPLIES) IMPLANT
PADDING CAST COTTON 6X4 STRL (CAST SUPPLIES) IMPLANT
PADDING CAST SYNTHETIC 6X4 NS (CAST SUPPLIES) IMPLANT
PENCIL SMOKE EVACUATOR (MISCELLANEOUS) IMPLANT
SANITIZER HAND ALTRA PUMP 550 (MISCELLANEOUS) ×1 IMPLANT
SHEET MEDIUM DRAPE 40X70 STRL (DRAPES) ×1 IMPLANT
SOLN 0.9% NACL POUR BTL 1000ML (IV SOLUTION) ×1 IMPLANT
SPLINT FIBERGLASS 4X30 (CAST SUPPLIES) IMPLANT
SPLINT PLASTER CAST FAST 5X30 (CAST SUPPLIES) IMPLANT
SPONGE T-LAP 18X18 ~~LOC~~+RFID (SPONGE) ×1 IMPLANT
STAPLER SKIN PROX WIDE 3.9 (STAPLE) IMPLANT
STOCKINETTE 6 STRL (DRAPES) ×1 IMPLANT
STOCKINETTE SYNTHETIC 4 NONSTR (MISCELLANEOUS) IMPLANT
STRIP CLOSURE SKIN 1/2X4 (GAUZE/BANDAGES/DRESSINGS) IMPLANT
SUCTION TUBE FRAZIER 10FR DISP (SUCTIONS) IMPLANT
SUT ETHILON 2 0 FS 18 (SUTURE) IMPLANT
SUT ETHILON 3 0 PS 1 (SUTURE) IMPLANT
SUT MNCRL AB 3-0 PS2 18 (SUTURE) IMPLANT
SUT VIC AB 2-0 CT1 TAPERPNT 27 (SUTURE) IMPLANT
SUT VICRYL 0 SH 27 (SUTURE) IMPLANT
SYR BULB EAR ULCER 3OZ GRN STR (SYRINGE) IMPLANT
SYR CONTROL 10ML LL (SYRINGE) IMPLANT
TOWEL GREEN STERILE FF (TOWEL DISPOSABLE) ×1 IMPLANT
TUBE CONNECTING 20X1/4 (TUBING) IMPLANT
UNDERPAD 30X36 HEAVY ABSORB (UNDERPADS AND DIAPERS) ×1 IMPLANT
YANKAUER SUCT BULB TIP NO VENT (SUCTIONS) IMPLANT

## 2024-06-08 NOTE — Progress Notes (Signed)
Assisted Dr. Gifford Shave with left, adductor canal, popliteal, ultrasound guided block. Side rails up, monitors on throughout procedure. See vital signs in flow sheet. Tolerated Procedure well.

## 2024-06-08 NOTE — Transfer of Care (Signed)
 Immediate Anesthesia Transfer of Care Note  Patient: Laura Medina  Procedure(s) Performed: FASCIECTOMY, PLANTAR (Left: Leg Lower)  Patient Location: PACU  Anesthesia Type:General  Level of Consciousness: awake and patient cooperative  Airway & Oxygen Therapy: Patient Spontanous Breathing and Patient connected to nasal cannula oxygen  Post-op Assessment: Report given to RN and Post -op Vital signs reviewed and stable  Post vital signs: Reviewed and stable  Last Vitals:  Vitals Value Taken Time  BP    Temp    Pulse 103 06/08/24 11:21  Resp 14 06/08/24 11:21  SpO2 98 % 06/08/24 11:21  Vitals shown include unfiled device data.  Last Pain:  Vitals:   06/08/24 0841  TempSrc: Temporal  PainSc: 5       Patients Stated Pain Goal: 8 (06/08/24 0841)  Complications: No notable events documented.

## 2024-06-08 NOTE — H&P (Signed)

## 2024-06-08 NOTE — Anesthesia Procedure Notes (Signed)
 Anesthesia Regional Block: Adductor canal block   Pre-Anesthetic Checklist: , timeout performed,  Correct Patient, Correct Site, Correct Laterality,  Correct Procedure, Correct Position, site marked,  Risks and benefits discussed,  Surgical consent,  Pre-op evaluation,  At surgeon's request and post-op pain management  Laterality: Left  Prep: chloraprep       Needles:  Injection technique: Single-shot  Needle Type: Echogenic Needle     Needle Length: 9cm  Needle Gauge: 21     Additional Needles:   Procedures:,,,, ultrasound used (permanent image in chart),,    Narrative:  Start time: 06/08/2024 9:39 AM End time: 06/08/2024 9:45 AM Injection made incrementally with aspirations every 5 mL.  Performed by: Personally  Anesthesiologist: Corinne Garnette BRAVO, MD  Additional Notes: No pain on injection. No increased resistance to injection. Injection made in 5cc increments.  Good needle visualization.  Patient tolerated procedure well.

## 2024-06-08 NOTE — Progress Notes (Signed)
 No ibuprofen /NSAIDs until after 810pm written on dc paperwork.

## 2024-06-08 NOTE — Anesthesia Procedure Notes (Signed)
 Anesthesia Regional Block: Popliteal block   Pre-Anesthetic Checklist: , timeout performed,  Correct Patient, Correct Site, Correct Laterality,  Correct Procedure, Correct Position, site marked,  Risks and benefits discussed,  Surgical consent,  Pre-op evaluation,  At surgeon's request and post-op pain management  Laterality: Left  Prep: chloraprep       Needles:  Injection technique: Single-shot  Needle Type: Echogenic Needle     Needle Length: 9cm  Needle Gauge: 21     Additional Needles:   Procedures:,,,, ultrasound used (permanent image in chart),,    Narrative:  Start time: 06/08/2024 9:33 AM End time: 06/08/2024 9:39 AM Injection made incrementally with aspirations every 5 mL.  Performed by: Personally  Anesthesiologist: Corinne Garnette BRAVO, MD  Additional Notes: No pain on injection. No increased resistance to injection. Injection made in 5cc increments.  Good needle visualization.  Patient tolerated procedure well.

## 2024-06-08 NOTE — Anesthesia Procedure Notes (Signed)
 Procedure Name: LMA Insertion Date/Time: 06/08/2024 10:30 AM  Performed by: Donnell Berwyn SQUIBB, CRNAPre-anesthesia Checklist: Patient identified, Emergency Drugs available, Suction available, Patient being monitored and Timeout performed Patient Re-evaluated:Patient Re-evaluated prior to induction Oxygen Delivery Method: Circle system utilized Preoxygenation: Pre-oxygenation with 100% oxygen Induction Type: IV induction Ventilation: Mask ventilation without difficulty LMA: LMA inserted LMA Size: 4.0 Number of attempts: 1 Placement Confirmation: positive ETCO2 and breath sounds checked- equal and bilateral Dental Injury: Teeth and Oropharynx as per pre-operative assessment

## 2024-06-08 NOTE — Anesthesia Preprocedure Evaluation (Addendum)
 Anesthesia Evaluation  Patient identified by MRN, date of birth, ID band Patient awake    Reviewed: Allergy & Precautions, NPO status , Patient's Chart, lab work & pertinent test results  Airway Mallampati: II  TM Distance: >3 FB Neck ROM: Full    Dental  (+) Teeth Intact, Dental Advisory Given   Pulmonary neg pulmonary ROS   Pulmonary exam normal breath sounds clear to auscultation       Cardiovascular hypertension, Pt. on medications (-) angina (-) Past MI Normal cardiovascular exam Rhythm:Regular Rate:Normal     Neuro/Psych negative neurological ROS  negative psych ROS   GI/Hepatic Neg liver ROS,GERD  Medicated and Controlled,,  Endo/Other  Obesity   Renal/GU negative Renal ROS     Musculoskeletal  (+) Arthritis ,  Fibromyalgia -Plantar fasciitis   Abdominal   Peds  Hematology negative hematology ROS (+)   Anesthesia Other Findings Day of surgery medications reviewed with the patient.  Reproductive/Obstetrics                              Anesthesia Physical Anesthesia Plan  ASA: 2  Anesthesia Plan: General   Post-op Pain Management: Tylenol  PO (pre-op)*, Regional block* and Toradol  IV (intra-op)*   Induction: Intravenous  PONV Risk Score and Plan: 3 and Midazolam, Dexamethasone and Ondansetron   Airway Management Planned: LMA  Additional Equipment:   Intra-op Plan:   Post-operative Plan: Extubation in OR  Informed Consent: I have reviewed the patients History and Physical, chart, labs and discussed the procedure including the risks, benefits and alternatives for the proposed anesthesia with the patient or authorized representative who has indicated his/her understanding and acceptance.     Dental advisory given  Plan Discussed with: CRNA  Anesthesia Plan Comments:          Anesthesia Quick Evaluation

## 2024-06-08 NOTE — Op Note (Signed)
 06/08/2024  11:52 AM   PATIENT: Laura Medina  58 y.o. female  MRN: [id number]   PRE-OPERATIVE DIAGNOSIS:   1] Left plantar fasciitis 2] Left gastrocnemius contracture 3] Left posterior tibialis tendinopathy and tearing   POST-OPERATIVE DIAGNOSIS:   Same   PROCEDURE: 1] Left plantar partial fasciectomy  2] Left leg open gastrocnemius recession 3] Left posterior tibialis tendon debridement and primary repair   SURGEON:  Lillia Mountain, MD   ASSISTANT: None   ANESTHESIA: General, regional   EBL: Minimal   TOURNIQUET:    Total Tourniquet Time Documented: Thigh (laterality) - 28 minutes Total: Thigh (laterality) - 28 minutes    COMPLICATIONS: None apparent   DISPOSITION: Extubated, awake and stable to recovery.   INDICATION FOR PROCEDURE: The patient presented with above diagnosis.  We discussed the diagnosis, alternative treatment options, risks and benefits of the above surgical intervention, as well as alternative non-operative treatments. All questions/concerns were addressed and the patient/family demonstrated appropriate understanding of the diagnosis, the procedure, the postoperative course, and overall prognosis. The patient wished to proceed with surgical intervention and signed an informed surgical consent as such, in each others presence prior to surgery.   PROCEDURE IN DETAIL: After preoperative consent was obtained and the correct operative site was identified, the patient was brought to the operating room supine on stretcher. General anesthesia was induced. Preoperative antibiotics were administered. Surgical timeout was taken. The patient was then positioned supine. The operative lower extremity was prepped and draped in standard sterile fashion with a tourniquet around the thigh. The extremity was exsanguinated and the tourniquet was inflated to 275 mmHg.  We then made a limited plantar incision 2 cm distal to the origin of the plantar  fascia to perform the plantar fasciectomy. Dissection was carefully carried down to the level of the plantar fascia taking care to protected nerve branches. We then used a freer elevator to define the medial and central portions of the plantar fascia. A fresh 15 blade was used to perform a plantar fasciectomy with excellent relaxation of the inflamed thickened fascia in this region.  We then made a longitudinal incision centered over the distal posterior tibialis tendon in the region of preop pain. The tendon was debrided and longitudinal tearing noted. This was repaired primarily. Excellent excursion of the tendon was noted post repair.   A Silfverskiold test was performed again under anesthesia and the patient was noted to have gastrocnemius tightness. Therefore, a gastrocnemius recession was performed. We made a longitudinal incision along the medial aspect of the mid leg. Dissection carefully carried down to the level of the gastrocnemius fascia. The recession was performed while protecting neurovascular structures and excellent improvement in ankle dorsiflexion ROM was noted.    The surgical sites were thoroughly irrigated. The tourniquet was deflated and hemostasis achieved. Betadine and vancomycin powder were applied. The deep layers were closed using 2-0 vicryl. The skin was closed without tension using 2-0 nylon suture.    The leg was cleaned with saline and sterile dressings with gauze were applied. A well padded bulky short leg splint was applied. The patient was awakened from anesthesia and transported to the recovery room in stable condition.      FOLLOW UP PLAN: -transfer to PACU, then home -strict NWB operative extremity, maximum elevation -maintain short leg splint until follow up -DVT ppx: Aspirin  81 mg twice daily while NWB -follow up as outpatient within 7-10 days for wound check with exchange of short leg splint -sutures out in 2-3  weeks in outpatient office      RADIOGRAPHS: None used     Lillia Mountain [facility]

## 2024-06-08 NOTE — Progress Notes (Signed)
 Last time received ultram  at 1230pm written on dc paperwork.

## 2024-06-08 NOTE — Anesthesia Postprocedure Evaluation (Signed)
 Anesthesia Post Note  Patient: Laura Medina  Procedure(s) Performed: FASCIECTOMY, PLANTAR (Left: Leg Lower)     Patient location during evaluation: PACU Anesthesia Type: General Level of consciousness: awake and alert Pain management: pain level controlled Vital Signs Assessment: post-procedure vital signs reviewed and stable Respiratory status: spontaneous breathing, nonlabored ventilation and respiratory function stable Cardiovascular status: blood pressure returned to baseline and stable Postop Assessment: no apparent nausea or vomiting Anesthetic complications: no   No notable events documented.  Last Vitals:  Vitals:   06/08/24 1215 06/08/24 1245  BP:  121/72  Pulse: 81 85  Resp: 15 15  Temp:  36.7 C  SpO2: 99% 98%    Last Pain:                 Laura Medina
